# Patient Record
Sex: Female | Born: 1955 | Race: White | Hispanic: No | Smoking: Never smoker
Health system: Southern US, Community
[De-identification: ages and names within clinical notes are randomized; demographics above are authoritative.]

## PROBLEM LIST (undated history)

## (undated) DIAGNOSIS — I639 Cerebral infarction, unspecified: Secondary | ICD-10-CM

## (undated) DIAGNOSIS — C801 Malignant (primary) neoplasm, unspecified: Secondary | ICD-10-CM

## (undated) DIAGNOSIS — E079 Disorder of thyroid, unspecified: Secondary | ICD-10-CM

## (undated) HISTORY — DX: Disorder of thyroid, unspecified: E07.9

## (undated) HISTORY — PX: CARPAL TUNNEL RELEASE: SHX101

## (undated) HISTORY — PX: BREAST ENHANCEMENT SURGERY: SHX7

## (undated) HISTORY — DX: Cerebral infarction, unspecified: I63.9

## (undated) HISTORY — PX: COLONOSCOPY: SHX174

## (undated) HISTORY — DX: Malignant (primary) neoplasm, unspecified: C80.1

---

## 2007-03-21 DIAGNOSIS — I7300 Raynaud's syndrome without gangrene: Secondary | ICD-10-CM | POA: Insufficient documentation

## 2007-03-21 DIAGNOSIS — G43829 Menstrual migraine, not intractable, without status migrainosus: Secondary | ICD-10-CM | POA: Insufficient documentation

## 2010-07-08 DIAGNOSIS — J45909 Unspecified asthma, uncomplicated: Secondary | ICD-10-CM | POA: Insufficient documentation

## 2010-07-08 DIAGNOSIS — J309 Allergic rhinitis, unspecified: Secondary | ICD-10-CM | POA: Insufficient documentation

## 2013-12-20 DIAGNOSIS — B002 Herpesviral gingivostomatitis and pharyngotonsillitis: Secondary | ICD-10-CM | POA: Insufficient documentation

## 2014-03-12 DIAGNOSIS — G5601 Carpal tunnel syndrome, right upper limb: Secondary | ICD-10-CM | POA: Insufficient documentation

## 2014-07-13 DIAGNOSIS — M1611 Unilateral primary osteoarthritis, right hip: Secondary | ICD-10-CM | POA: Insufficient documentation

## 2014-07-22 DIAGNOSIS — M25551 Pain in right hip: Secondary | ICD-10-CM | POA: Insufficient documentation

## 2015-03-15 HISTORY — PX: NEURECTOMY FOOT: SUR888

## 2015-09-30 DIAGNOSIS — F4321 Adjustment disorder with depressed mood: Secondary | ICD-10-CM | POA: Insufficient documentation

## 2017-09-21 DIAGNOSIS — I639 Cerebral infarction, unspecified: Secondary | ICD-10-CM

## 2017-09-21 HISTORY — DX: Cerebral infarction, unspecified: I63.9

## 2017-12-25 ENCOUNTER — Encounter: Payer: Self-pay | Admitting: Gastroenterology

## 2018-01-18 ENCOUNTER — Ambulatory Visit (AMBULATORY_SURGERY_CENTER): Payer: Self-pay | Admitting: *Deleted

## 2018-01-18 ENCOUNTER — Encounter: Payer: Self-pay | Admitting: Gastroenterology

## 2018-01-18 ENCOUNTER — Telehealth: Payer: Self-pay | Admitting: *Deleted

## 2018-01-18 VITALS — Ht 62.0 in | Wt 115.0 lb

## 2018-01-18 DIAGNOSIS — Z1211 Encounter for screening for malignant neoplasm of colon: Secondary | ICD-10-CM

## 2018-01-18 MED ORDER — PEG-KCL-NACL-NASULF-NA ASC-C 140 G PO SOLR: 1.0000 | Freq: Once | ORAL | 0 refills | Status: AC

## 2018-01-18 NOTE — Telephone Encounter (Signed)
Patient came for PV today for direct screening colonoscopy. She states she had "TIA September 21, 2017". She went to [facility], test done and she was release that day. No other problems or concerns since then. [place] for [employer]? Please advise. Thank you, Robbin pv

## 2018-01-18 NOTE — Progress Notes (Signed)
Patient denies any allergies to eggs or soy. Patient denies any problems with anesthesia/sedation. Patient denies any oxygen use at home. Patient denies taking any diet/weight loss medications or blood thinners. EMMI education offered, pt declined. Plenvu universal co pay card given to pt.

## 2018-01-19 NOTE — Telephone Encounter (Signed)
Robbin,  This pt is cleared for anesthetic care at [facility].   Thanks,   Lakeem Rozo 

## 2018-01-19 NOTE — Telephone Encounter (Signed)
Noted! Thank you

## 2018-01-31 ENCOUNTER — Ambulatory Visit (AMBULATORY_SURGERY_CENTER): Payer: PRIVATE HEALTH INSURANCE | Admitting: Gastroenterology

## 2018-01-31 ENCOUNTER — Encounter: Payer: Self-pay | Admitting: Gastroenterology

## 2018-01-31 VITALS — BP 120/70 | HR 63 | Temp 97.8°F | Resp 12 | Ht 62.0 in | Wt 115.0 lb

## 2018-01-31 DIAGNOSIS — Z1211 Encounter for screening for malignant neoplasm of colon: Secondary | ICD-10-CM | POA: Diagnosis not present

## 2018-01-31 MED ORDER — SODIUM CHLORIDE 0.9 % IV SOLN: 500.0000 mL | Freq: Once | INTRAVENOUS | Status: DC

## 2018-01-31 NOTE — Progress Notes (Signed)
PT taken to PACU. Monitors in place. VSS. Report given to RN. 

## 2018-01-31 NOTE — Patient Instructions (Signed)
YOU HAD AN ENDOSCOPIC PROCEDURE TODAY AT THE [facility]:   Refer to the procedure report that was given to you for any specific questions about what was found during the examination.  If the procedure report does not answer your questions, please call your gastroenterologist to clarify.  If you requested that your care partner not be given the details of your procedure findings, then the procedure report has been included in a sealed envelope for you to review at your convenience later.  YOU SHOULD EXPECT: Some feelings of bloating in the abdomen. Passage of more gas than usual.  Walking can help get rid of the air that was put into your GI tract during the procedure and reduce the bloating. If you had a lower endoscopy (such as a colonoscopy or flexible sigmoidoscopy) you may notice spotting of blood in your stool or on the toilet paper. If you underwent a bowel prep for your procedure, you may not have a normal bowel movement for a few days.  Please Note:  You might notice some irritation and congestion in your nose or some drainage.  This is from the oxygen used during your procedure.  There is no need for concern and it should clear up in a day or so.  SYMPTOMS TO REPORT IMMEDIATELY:   Following lower endoscopy (colonoscopy or flexible sigmoidoscopy):  Excessive amounts of blood in the stool  Significant tenderness or worsening of abdominal pains  Swelling of the abdomen that is new, acute  Fever of 100F or higher  For urgent or emergent issues, a gastroenterologist can be reached at any hour by calling [phone].   DIET:  We do recommend a small meal at first, but then you may proceed to your regular diet.  Drink plenty of fluids but you should avoid alcoholic beverages for 24 hours.  ACTIVITY:  You should plan to take it easy for the rest of today and you should NOT DRIVE or use heavy machinery until tomorrow (because of the sedation medicines used during the test).     FOLLOW UP: Our staff will call the number listed on your records the next business day following your procedure to check on you and address any questions or concerns that you may have regarding the information given to you following your procedure. If we do not reach you, we will leave a message.  However, if you are feeling well and you are not experiencing any problems, there is no need to return our call.  We will assume that you have returned to your regular daily activities without incident.  If any biopsies were taken you will be contacted by phone or by letter within the next 1-3 weeks.  Please call us at [phone] if you have not heard about the biopsies in 3 weeks.    SIGNATURES/CONFIDENTIALITY: You and/or your care partner have signed paperwork which will be entered into your electronic medical record.  These signatures attest to the fact that that the information above on your After Visit Summary has been reviewed and is understood.  Full responsibility of the confidentiality of this discharge information lies with you and/or your care-partner. 

## 2018-01-31 NOTE — Op Note (Addendum)
[facility] Patient Name: Evelyn Martinez Procedure Date: 01/31/2018 2:10 PM MRN: [id number] Endoscopist: Mallie Mussel L. Loletha Carrow , MD Age: 62 Referring MD:  Date of Birth: 1956/02/15 Gender: Female Account #: [id number] Procedure:                Colonoscopy Indications:              Screening for colorectal malignant neoplasm                            (reportedly normal colonoscopy > 10 years ago at                            outside institution) Medicines:                Monitored Anesthesia Care Procedure:                After obtaining informed consent, the colonoscope                            was passed under direct vision. Throughout the                            procedure, the patient's blood pressure, pulse, and                            oxygen saturations were monitored continuously. The                            Model PCF-[id number]) scope was introduced                            through the anus and advanced to the the cecum,                            identified by appendiceal orifice and ileocecal                            valve. The colonoscopy was performed without                            difficulty. The patient tolerated the procedure                            well. The quality of the bowel preparation was                            excellent. The ileocecal valve, appendiceal                            orifice, and rectum were photographed. The bowel                            preparation used was Plenvu. Scope In: 2:20:02 PM Scope Out: 2:32:59 PM Scope Withdrawal Time: 0 hours 8 minutes 53 seconds  Total Procedure Duration: 0 hours 12  minutes 57 seconds  Findings:                 The perianal and digital rectal examinations were                            normal.                           The entire examined colon appeared normal on direct                            and retroflexion views. Complications:            No immediate  complications. Estimated Blood Loss:     Estimated blood loss: none. Impression:               - The entire examined colon is normal on direct and                            retroflexion views.                           - No specimens collected. Recommendation:           - Patient has a contact number available for                            emergencies. The signs and symptoms of potential                            delayed complications were discussed with the                            patient. Return to normal activities tomorrow.                            Written discharge instructions were provided to the                            patient.                           - Resume previous diet.                           - Continue present medications.                           - Repeat colonoscopy in 10 years for screening                            purposes. Tedi Hughson L. Loletha Carrow, MD 01/31/2018 2:39:31 PM This report has been signed electronically.

## 2018-02-01 ENCOUNTER — Telehealth: Payer: Self-pay | Admitting: *Deleted

## 2018-02-01 NOTE — Telephone Encounter (Signed)
  Follow up Call-  Call back number 01/31/2018  Post procedure Call Back phone  # [phone]  Permission to leave phone message Yes     Patient questions:  Do you have a fever, pain , or abdominal swelling? No. Pain Score  0 *  Have you tolerated food without any problems? Yes.    Have you been able to return to your normal activities? Yes.    Do you have any questions about your discharge instructions: Diet   No. Medications  No. Follow up visit  No.  Do you have questions or concerns about your Care? No.  Actions: * If pain score is 4 or above: No action needed, pain <4.

## 2018-10-17 ENCOUNTER — Ambulatory Visit: Payer: PRIVATE HEALTH INSURANCE | Admitting: Internal Medicine

## 2018-11-15 ENCOUNTER — Ambulatory Visit: Payer: PRIVATE HEALTH INSURANCE | Admitting: Internal Medicine

## 2018-12-20 ENCOUNTER — Ambulatory Visit (INDEPENDENT_AMBULATORY_CARE_PROVIDER_SITE_OTHER): Payer: PRIVATE HEALTH INSURANCE | Admitting: Internal Medicine

## 2018-12-20 ENCOUNTER — Other Ambulatory Visit: Payer: Self-pay

## 2018-12-20 VITALS — BP 112/70 | HR 66 | Temp 97.2°F | Resp 16 | Ht 62.0 in | Wt 118.6 lb

## 2018-12-20 DIAGNOSIS — Z136 Encounter for screening for cardiovascular disorders: Secondary | ICD-10-CM | POA: Diagnosis not present

## 2018-12-20 DIAGNOSIS — Z111 Encounter for screening for respiratory tuberculosis: Secondary | ICD-10-CM

## 2018-12-20 DIAGNOSIS — Z13220 Encounter for screening for lipoid disorders: Secondary | ICD-10-CM | POA: Diagnosis not present

## 2018-12-20 DIAGNOSIS — E559 Vitamin D deficiency, unspecified: Secondary | ICD-10-CM

## 2018-12-20 DIAGNOSIS — J4520 Mild intermittent asthma, uncomplicated: Secondary | ICD-10-CM

## 2018-12-20 DIAGNOSIS — E60 Dietary zinc deficiency: Secondary | ICD-10-CM

## 2018-12-20 DIAGNOSIS — Z79899 Other long term (current) drug therapy: Secondary | ICD-10-CM

## 2018-12-20 DIAGNOSIS — Z131 Encounter for screening for diabetes mellitus: Secondary | ICD-10-CM | POA: Diagnosis not present

## 2018-12-20 DIAGNOSIS — Z130 Encounter for screening for diseases of the blood and blood-forming organs and certain disorders involving the immune mechanism: Secondary | ICD-10-CM | POA: Diagnosis not present

## 2018-12-20 DIAGNOSIS — Z0001 Encounter for general adult medical examination with abnormal findings: Secondary | ICD-10-CM

## 2018-12-20 DIAGNOSIS — R030 Elevated blood-pressure reading, without diagnosis of hypertension: Secondary | ICD-10-CM | POA: Diagnosis not present

## 2018-12-20 DIAGNOSIS — Z0000 Encounter for general adult medical examination without abnormal findings: Secondary | ICD-10-CM

## 2018-12-20 DIAGNOSIS — Z1389 Encounter for screening for other disorder: Secondary | ICD-10-CM

## 2018-12-20 DIAGNOSIS — R5383 Other fatigue: Secondary | ICD-10-CM

## 2018-12-20 DIAGNOSIS — E039 Hypothyroidism, unspecified: Secondary | ICD-10-CM

## 2018-12-20 DIAGNOSIS — R7309 Other abnormal glucose: Secondary | ICD-10-CM

## 2018-12-20 MED ORDER — LEVOTHYROXINE SODIUM 25 MCG PO TABS: ORAL_TABLET | ORAL | 1 refills | Status: DC

## 2018-12-20 NOTE — Progress Notes (Signed)
Annual Screening/Preventative Visit & Comprehensive Evaluation &  Examination     This very nice 64 y.o.  WWF presents for a Screening /Preventative Visit & comprehensive evaluation and management of multiple medical co-morbidities.  Patient has been followed for labile elevated BP, abn lipids, glucose & Vitamin D Deficiency.      Patient had an episode of unilateral visual loss July 2019 and was evaluated in an ER with an extensive negative w/u including neg CT & MRA scans. Patient's BP was slightly elevated at that encounter & normalized.  Patient denies any cardiac symptoms as chest pain, palpitations, shortness of breath, dizziness or ankle swelling. Today's BP is at goal - 112/70.      Patient's lipids have been controlled with diet.     Patient had been on [place] Thyroid in the past , but had been off recently and about 3 weeks ago TSH was sl elevated at 4.490.     Finally, patient has history of relative Vitamin D Deficiency ("44.5"  / 2018).  Current Outpatient Medications on File Prior to Visit  Medication Sig  . albuterol (PROAIR HFA) 108 (90 Base) MCG/ACT inhaler INHALE 1 TO 2 PUFFS EVERY 4 TO 6 HOURS AS NEEDED  . Ascorbic Acid (VITAMIN C PO) Take 2,000 mg by mouth.   Jolyne Loa Grape-Goldenseal (BERBERINE COMPLEX PO) Take by mouth daily.  Marland Kitchen DICLOFENAC PO Take by mouth. Takes PRN for back pain  . ELDERBERRY PO Take by mouth. Takes 2 gummies daily  . fexofenadine (ALLEGRA) 180 MG tablet Take by mouth.  . fluticasone (FLONASE) 50 MCG/ACT nasal spray Place into the nose.  Marland Kitchen MAGNESIUM MALATE PO Take by mouth. Takes 1 tablet daily.  . montelukast (SINGULAIR) 10 MG tablet TAKE 1 TABLET BY MOUTH EVERYDAY AT BEDTIME  . OVER THE COUNTER MEDICATION Takes 2 B-complex gummies  daily.  Marland Kitchen OVER THE COUNTER MEDICATION Takes CBD 50 mg daily.  Marland Kitchen thyroid (ARMOUR) 65 MG tablet Take 75 mg by mouth.   . valACYclovir (VALTREX) 1000 MG tablet Take 1,000 mg by mouth daily. As needed  . VITAMIN D  PO Take by mouth. Takes 2 to 3 times a week  . VITAMIN K PO Take by mouth. Takes 2 to 3 times a week   No current facility-administered medications on file prior to visit.    Allergies  Allergen Reactions  . Epinephrine Palpitations    Increased HR   . Bactrim [Sulfamethoxazole-Trimethoprim] Hives  . Sulfamethoxazole Rash   Past Medical History:  Diagnosis Date  . 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 Stroke ([place]) 09/21/2017   TIA ( pt states under a lot of anxiety and stress);went home from [place]   . Thyroid disease    Health Maintenance  Topic Date Due  . Hepatitis C Screening  1955-04-26  . HIV Screening  10/14/1970  . PAP SMEAR-Modifier  10/13/1976  . MAMMOGRAM  10/13/2005  . INFLUENZA VACCINE  10/13/2018  . TETANUS/TDAP  05/02/2019  . COLONOSCOPY  02/01/2028   Last Colon - 01/31/2018 - Colon - Negative - dr Loletha Carrow - Recc 10 yr f/u due Dec 2029  Last MGM - 10/07/20120 - [place] & Negative  Past Surgical History:  Procedure Laterality Date  . BREAST ENHANCEMENT SURGERY     x2  . CARPAL TUNNEL RELEASE Bilateral   . CESAREAN SECTION  1983  . COLONOSCOPY  10 years ago    [place]=Normal exam per pt  . NEURECTOMY FOOT  2017   Family History  Problem Relation Age of Onset  . Uterine cancer Mother   . Diabetes Father   . Diabetes Brother   . Diabetes Sister   . Diabetes Sister   . Colon cancer Neg Hx   . Colon polyps Neg Hx   . Esophageal cancer Neg Hx   . Rectal cancer Neg Hx   . Stomach cancer Neg Hx    Social History   Tobacco Use  . Smoking status: Never Smoker  . Smokeless tobacco: Never Used  Substance Use Topics  . Alcohol use: Yes    Alcohol/week: 14.0 standard drinks    Types: 14 Glasses of wine per week  . Drug use: Not Currently    ROS Constitutional: Denies fever, chills, weight loss/gain, headaches, insomnia,  night sweats, and change in appetite. Does c/o fatigue. Eyes: Denies redness, blurred vision, diplopia,  discharge, itchy, watery eyes.  ENT: Denies discharge, congestion, post nasal drip, epistaxis, sore throat, earache, hearing loss, dental pain, Tinnitus, Vertigo, Sinus pain, snoring.  Cardio: Denies chest pain, palpitations, irregular heartbeat, syncope, dyspnea, diaphoresis, orthopnea, PND, claudication, edema Respiratory: denies cough, dyspnea, DOE, pleurisy, hoarseness, laryngitis, wheezing.  Gastrointestinal: Denies dysphagia, heartburn, reflux, water brash, pain, cramps, nausea, vomiting, bloating, diarrhea, constipation, hematemesis, melena, hematochezia, jaundice, hemorrhoids Genitourinary: Denies dysuria, frequency, urgency, nocturia, hesitancy, discharge, hematuria, flank pain Breast: Breast lumps, nipple discharge, bleeding.  Musculoskeletal: Denies arthralgia, myalgia, stiffness, Jt. Swelling, pain, limp, and strain/sprain. Denies falls. Skin: Denies puritis, rash, hives, warts, acne, eczema, changing in skin lesion Neuro: No weakness, tremor, incoordination, spasms, paresthesia, pain Psychiatric: Denies confusion, memory loss, sensory loss. Denies Depression. Endocrine: Denies change in weight, skin, hair change, nocturia, and paresthesia, diabetic polys, visual blurring, hyper / hypo glycemic episodes.  Heme/Lymph: No excessive bleeding, bruising, enlarged lymph nodes.  Physical Exam  BP 112/70   Pulse 66   Temp (!) 97.2 F (36.2 C)   Resp 16   Ht 5\' 2"  (1.575 m)   Wt 118 lb 9.6 oz (53.8 kg)   BMI 21.69 kg/m   General Appearance: Well nourished, well groomed and in no apparent distress.  Eyes: PERRLA, EOMs, conjunctiva no swelling or erythema, normal fundi and vessels. Sinuses: No frontal/maxillary tenderness ENT/Mouth: EACs patent / TMs  nl. Nares clear without erythema, swelling, mucoid exudates. Oral hygiene is good. No erythema, swelling, or exudate. Tongue normal, non-obstructing. Tonsils not swollen or erythematous. Hearing normal.  Neck: Supple, thyroid not  palpable. No bruits, nodes or JVD. Respiratory: Respiratory effort normal.  BS equal and clear bilateral without rales, rhonci, wheezing or stridor. Cardio: Heart sounds are normal with regular rate and rhythm and no murmurs, rubs or gallops. Peripheral pulses are normal and equal bilaterally without edema. No aortic or femoral bruits. Chest: symmetric with normal excursions and percussion. Breasts: Symmetric, without lumps, nipple discharge, retractions, or fibrocystic changes.  Abdomen: Flat, soft with bowel sounds active. Nontender, no guarding, rebound, hernias, masses, or organomegaly.  Lymphatics: Non tender without lymphadenopathy.  Genitourinary: Had recent pap in June 2020 at her [place]. Musculoskeletal: Full ROM all peripheral extremities, joint stability, 5/5 strength, and normal gait. Skin: Warm and dry without rashes, lesions, cyanosis, clubbing or  ecchymosis.  Neuro: Cranial nerves intact, reflexes equal bilaterally. Normal muscle tone, no cerebellar symptoms. Sensation intact.  Pysch: Alert and oriented X 3, normal affect, Insight and Judgment appropriate.   Assessment and Plan  1. Annual Preventative Screening Examination  2. Elevated BP without diagnosis of hypertension  -  EKG 12-Lead - Urinalysis, Routine w reflex microscopic - Microalbumin / creatinine urine ratio - CBC with Differential/Platelet - Magnesium  3. Lipid screening  - EKG 12-Lead - Lipid panel  4. Abnormal glucose  - EKG 12-Lead - Hemoglobin A1c - Insulin, random  5. Vitamin D deficiency  - VITAMIN D 25 Hydroxyl  6. Acquired hypothyroidism  - levothyroxine (SYNTHROID) 25 MCG tablet; Take 1 tablet daily on an empty stomach with only water for 30 minutes & no Antacid meds, Calcium or Magnesium for 4 hours & avoid Biotin  Dispense: 90 tablet; Refill: 1  7. Zinc deficiency  - Zinc  8. Mild intermittent asthma without complication   9. Screening-pulmonary TB  - TB Skin Test  10.  Screening for ischemic heart disease  - EKG 12-Lead  11. Screening for AAA (aortic abdominal aneurysm)  - [place], RETROPERITNL ABD,  LTD  12. Fatigue, unspecified type  - Iron,Total/Total Iron Binding Cap - Vitamin B12 - CBC with Differential/Platelet  13. Medication management  - Urinalysis, Routine w reflex microscopic - Microalbumin / creatinine urine ratio - CBC with Differential/Platelet - Magnesium - Lipid panel - Hemoglobin A1c - Insulin, random - VITAMIN D 25 Hydroxyl         Patient was counseled in prudent diet to achieve/maintain BMI less than 25 for weight control, BP monitoring, regular exercise and medications. Discussed med's effects and SE's. Screening labs and tests as requested with regular follow-up as recommended. Over 40 minutes of exam, counseling, chart review and high complex critical decision making was performed.   Kirtland Bouchard, MD

## 2018-12-20 NOTE — Patient Instructions (Signed)

## 2018-12-22 ENCOUNTER — Encounter: Payer: Self-pay | Admitting: Internal Medicine

## 2018-12-22 DIAGNOSIS — Z13220 Encounter for screening for lipoid disorders: Secondary | ICD-10-CM | POA: Insufficient documentation

## 2018-12-22 DIAGNOSIS — E559 Vitamin D deficiency, unspecified: Secondary | ICD-10-CM | POA: Insufficient documentation

## 2018-12-22 DIAGNOSIS — R030 Elevated blood-pressure reading, without diagnosis of hypertension: Secondary | ICD-10-CM | POA: Insufficient documentation

## 2018-12-22 DIAGNOSIS — E039 Hypothyroidism, unspecified: Secondary | ICD-10-CM | POA: Insufficient documentation

## 2018-12-22 DIAGNOSIS — R7309 Other abnormal glucose: Secondary | ICD-10-CM | POA: Insufficient documentation

## 2018-12-22 LAB — URINALYSIS, ROUTINE W REFLEX MICROSCOPIC
Bacteria, UA: NONE SEEN /HPF
Bilirubin Urine: NEGATIVE
Glucose, UA: NEGATIVE
Hgb urine dipstick: NEGATIVE
Hyaline Cast: NONE SEEN /LPF
Ketones, ur: NEGATIVE
Nitrite: NEGATIVE
Protein, ur: NEGATIVE
RBC / HPF: NONE SEEN /HPF (ref 0–2)
Specific Gravity, Urine: 1.021 (ref 1.001–1.03)
WBC, UA: NONE SEEN /HPF (ref 0–5)
pH: 6.5 (ref 5.0–8.0)

## 2018-12-22 LAB — CBC WITH DIFFERENTIAL/PLATELET
Absolute Monocytes: 523 cells/uL (ref 200–950)
Basophils Absolute: 48 cells/uL (ref 0–200)
Basophils Relative: 1 %
Eosinophils Absolute: 149 cells/uL (ref 15–500)
Eosinophils Relative: 3.1 %
HCT: 41.7 % (ref 35.0–45.0)
Hemoglobin: 13.7 g/dL (ref 11.7–15.5)
Lymphs Abs: 1992 cells/uL (ref 850–3900)
MCH: 31.4 pg (ref 27.0–33.0)
MCHC: 32.9 g/dL (ref 32.0–36.0)
MCV: 95.6 fL (ref 80.0–100.0)
MPV: 10.8 fL (ref 7.5–12.5)
Monocytes Relative: 10.9 %
Neutro Abs: 2088 cells/uL (ref 1500–7800)
Neutrophils Relative %: 43.5 %
Platelets: 263 10*3/uL (ref 140–400)
RBC: 4.36 10*6/uL (ref 3.80–5.10)
RDW: 13.1 % (ref 11.0–15.0)
Total Lymphocyte: 41.5 %
WBC: 4.8 10*3/uL (ref 3.8–10.8)

## 2018-12-22 LAB — HEMOGLOBIN A1C
Hgb A1c MFr Bld: 5.2 % of total Hgb (ref <5.7)
Mean Plasma Glucose: 103 (calc)
eAG (mmol/L): 5.7 (calc)

## 2018-12-22 LAB — LIPID PANEL
Cholesterol: 203 mg/dL — ABNORMAL HIGH (ref <200)
HDL: 117 mg/dL (ref >=50)
LDL Cholesterol (Calc): 76 mg/dL (calc)
Non-HDL Cholesterol (Calc): 86 mg/dL (calc) (ref <130)
Total CHOL/HDL Ratio: 1.7 (calc) (ref <5.0)
Triglycerides: 34 mg/dL (ref <150)

## 2018-12-22 LAB — IRON, TOTAL/TOTAL IRON BINDING CAP: Iron: 135 ug/dL (ref 45–160)

## 2018-12-22 LAB — INSULIN, RANDOM: Insulin: 2.6 u[IU]/mL

## 2018-12-22 LAB — IRON,?TOTAL/TOTAL IRON BINDING CAP
%SAT: 60 % (calc) — ABNORMAL HIGH (ref 16–45)
TIBC: 225 mcg/dL (calc) — ABNORMAL LOW (ref 250–450)

## 2018-12-22 LAB — MAGNESIUM: Magnesium: 1.9 mg/dL (ref 1.5–2.5)

## 2018-12-22 LAB — MICROALBUMIN / CREATININE URINE RATIO
Creatinine, Urine: 121 mg/dL (ref 20–275)
Microalb Creat Ratio: 2 mcg/mg creat (ref <30)
Microalb, Ur: 0.3 mg/dL

## 2018-12-22 LAB — VITAMIN B12: Vitamin B-12: 318 pg/mL (ref 200–1100)

## 2018-12-22 LAB — ZINC: Zinc: 75 ug/dL (ref 60–130)

## 2018-12-22 LAB — VITAMIN D 25 HYDROXY (VIT D DEFICIENCY, FRACTURES): Vit D, 25-Hydroxy: 68 ng/mL (ref 30–100)

## 2019-03-27 ENCOUNTER — Ambulatory Visit: Payer: PRIVATE HEALTH INSURANCE | Admitting: Adult Health

## 2019-04-18 ENCOUNTER — Ambulatory Visit (INDEPENDENT_AMBULATORY_CARE_PROVIDER_SITE_OTHER): Payer: PRIVATE HEALTH INSURANCE | Admitting: Internal Medicine

## 2019-04-18 ENCOUNTER — Other Ambulatory Visit: Payer: Self-pay

## 2019-04-18 VITALS — BP 116/76 | HR 60 | Temp 97.1°F | Resp 16 | Ht 62.0 in | Wt 120.0 lb

## 2019-04-18 DIAGNOSIS — J300 Vasomotor rhinitis: Secondary | ICD-10-CM | POA: Diagnosis not present

## 2019-04-18 MED ORDER — DEXAMETHASONE 4 MG PO TABS: ORAL_TABLET | ORAL | 0 refills | Status: DC

## 2019-04-18 MED ORDER — IPRATROPIUM BROMIDE 0.06 % NA SOLN: NASAL | 3 refills | Status: DC

## 2019-04-18 NOTE — Progress Notes (Signed)
History of Present Illness:     This very nice 64 yo MWF w hx/o Allergic Rhinitis presents with c/o watery nasal drainage w/o sx's of fever, sneezing, purulent nasal drainage or itching of eyes or nose   Medications  Current Outpatient Medications (Endocrine & Metabolic):  .  levothyroxine (SYNTHROID) 25 MCG tablet, Take 1 tablet daily on an empty stomach with only water for 30 minutes & no Antacid meds, Calcium or Magnesium for 4 hours & avoid Biotin .  albuterol (PROAIR HFA) 108 (90 Base) MCG/ACT inhaler, INHALE 1 TO 2 PUFFS EVERY 4 TO 6 HOURS AS NEEDED .  fexofenadine (ALLEGRA) 180 MG tablet, Take by mouth. .  fluticasone (FLONASE) 50 MCG/ACT nasal spray, Place into the nose. .  montelukast (SINGULAIR) 10 MG tablet, TAKE 1 TABLET BY MOUTH EVERYDAY AT BEDTIME .  DICLOFENAC PO, Take by mouth. Takes PRN for back pain .  Ascorbic Acid (VITAMIN C PO), Take 2,000 mg by mouth.  Jolyne Loa Grape-Goldenseal (BERBERINE COMPLEX PO), Take by mouth daily. Marland Kitchen  ELDERBERRY PO, Take by mouth. Takes 2 gummies daily .  MAGNESIUM MALATE PO, Take by mouth. Takes 1 tablet daily. Marland Kitchen  OVER THE COUNTER MEDICATION, Takes 2 B-complex gummies  daily. Marland Kitchen  OVER THE COUNTER MEDICATION, Takes CBD 50 mg daily. .  valACYclovir (VALTREX) 1000 MG tablet, Take 1,000 mg by mouth daily. As needed .  VITAMIN D PO, Take by mouth. Takes 2 to 3 times a week .  VITAMIN K PO, Take by mouth. Takes 2 to 3 times a week  Problem list She has Allergic rhinitis; Asthma; Herpes gingivostomatitis; Menstrual migraine; Primary osteoarthritis of right hip; Raynaud's phenomenon; Right carpal tunnel syndrome; Right hip pain; Situational depression; Acquired hypothyroidism; Elevated BP without diagnosis of hypertension; Lipid screening; Abnormal glucose; and Vitamin D deficiency on their problem list.   Observations/Objective:  BP 116/76   Pulse 60   Temp (!) 97.1 F (36.2 C)   Resp 16   Ht 5\' 2"  (1.575 m)   Wt 120 lb (54.4 kg)    BMI 21.95 kg/m   HEENT - WNL. Neck - supple.  Chest - Clear equal BS. Cor - Nl HS. RRR w/o sig MGR. PP 1(+). No edema. MS- FROM w/o deformities.  Gait Nl. Neuro -  Nl w/o focal abnormalities.  Assessment and Plan:  1. Vasomotor rhinitis  - ipratropium 0.06 % nasal spray; Use 1 to 2 sprays each nostril 2 to 3 x /day as needed  Disp: 45 mL; Rf 3  - dexamethasone 4 MG tablet; Take 1 tab 3 x day - 2 days, then 2 x day - 2 days, then 1 tab daily  Disp: 20 tab; Rf 0        I discussed the assessment and treatment plan with the patient. The patient was provided an opportunity to ask questions and all were answered. The patient agreed with the plan and demonstrated an understanding of the instructions. The patient was advised to call back or seek an in-person evaluation if the symptoms worsen or if the condition fails to improve as anticipated.  Kirtland Bouchard, MD  This note is not being shared with the patient for the following reason: To prevent harm (release of this note would result in harm to the life or physical safety of the patient or another).

## 2019-04-20 ENCOUNTER — Encounter: Payer: Self-pay | Admitting: Internal Medicine

## 2019-05-21 ENCOUNTER — Other Ambulatory Visit: Payer: Self-pay | Admitting: Internal Medicine

## 2019-05-21 MED ORDER — MONTELUKAST SODIUM 10 MG PO TABS: ORAL_TABLET | ORAL | 1 refills | Status: DC

## 2019-06-04 ENCOUNTER — Other Ambulatory Visit: Payer: Self-pay | Admitting: Internal Medicine

## 2019-06-04 DIAGNOSIS — E039 Hypothyroidism, unspecified: Secondary | ICD-10-CM

## 2019-06-04 DIAGNOSIS — Z79899 Other long term (current) drug therapy: Secondary | ICD-10-CM

## 2019-07-04 ENCOUNTER — Ambulatory Visit: Payer: PRIVATE HEALTH INSURANCE | Admitting: Internal Medicine

## 2019-07-29 ENCOUNTER — Ambulatory Visit: Payer: PRIVATE HEALTH INSURANCE | Admitting: Adult Health Nurse Practitioner

## 2019-07-29 ENCOUNTER — Ambulatory Visit: Payer: PRIVATE HEALTH INSURANCE | Admitting: Internal Medicine

## 2019-07-29 NOTE — Progress Notes (Deleted)
6 MONTH FOLLOW UP  Assessment and Plan:    Diagnoses and all orders for this visit:  Elevated BP without diagnosis of hypertension  Mild intermittent asthma without complication  Hypothyroidism, unspecified type  Vitamin D deficiency  Abnormal glucose  Raynaud's phenomenon without gangrene  Right carpal tunnel syndrome  Primary osteoarthritis of right hip  Situational depression  Lipid screening  Right hip pain  Medication management     Continue diet and meds as discussed. Further disposition pending results of labs. Discussed med's effects and SE's.  Patient agrees with plan of care and opportunity to ask questions/voice concerns. Over 30 minutes of chart review, face to face interview, exam, counseling, and critical decision making was performed.   Future Appointments  Date Time Provider [place]  07/29/2019 10:30 AM Garnet Sierras, NP [facility] None    ----------------------------------------------------------------------------------------------------------------------  HPI 64 y.o. female  presents for 3 month follow up on HTN, HLD, abnormal glucose with history of pre-diabetes, weight and vitamin D deficiency.   BMI is There is no height or weight on file to calculate BMI., she {HAS HAS [id number] been working on diet and exercise. Wt Readings from Last 3 Encounters:  04/18/19 120 lb (54.4 kg)  12/20/18 118 lb 9.6 oz (53.8 kg)  01/31/18 115 lb (52.2 kg)     HTN predates *** Her blood pressure {HAS HAS NOT:18834} been controlled at home, today their BP is    She {DOES_DOES [id number] workout. She denies any cardiac symptoms, chest pains, palpitations, shortness of breath, dizziness or lower extremity edema.      She {ACTION; IS/IS GI:087931 on cholesterol medication {Cholesterol meds:21887} and denies myalgias.   Her cholesterol {ACTION; IS/IS NOT:21021397} at goal. The cholesterol last visit was:   Lab Results  Component Value Date    CHOL 203 (H) 12/20/2018   HDL 117 12/20/2018   LDLCALC 76 12/20/2018   TRIG 34 12/20/2018   CHOLHDL 1.7 12/20/2018    She {Has/has not:18111} been working on diet and exercise for prediabetes, and denies {Symptoms; diabetes w/o none:19199}. Last A1C in the office was:  Lab Results  Component Value Date   HGBA1C 5.2 12/20/2018     Patient {Action; does/does not:19097} have history of CKD.  Their last GFR was:  No results found for: GFRNONAA No results found for: GFRAA   Patient is on Vitamin D supplement for deficiency ( *** ) Lab Results  Component Value Date   VD25OH 68 12/20/2018        Current Medications:  Current Outpatient Medications on File Prior to Visit  Medication Sig  . albuterol (PROAIR HFA) 108 (90 Base) MCG/ACT inhaler INHALE 1 TO 2 PUFFS EVERY 4 TO 6 HOURS AS NEEDED  . Ascorbic Acid (VITAMIN C PO) Take 2,000 mg by mouth.   . dexamethasone (DECADRON) 4 MG tablet Take 1 tab 3 x day - 2 days, then 2 x day - 2 days, then 1 tab daily  . ELDERBERRY PO Take by mouth. Takes 2 gummies daily  . fexofenadine (ALLEGRA) 180 MG tablet Take by mouth.  . fluticasone (FLONASE) 50 MCG/ACT nasal spray Place into the nose.  Marland Kitchen ipratropium (ATROVENT) 0.06 % nasal spray Use 1 to 2 sprays each nostril 2 to 3 x /day as needed  . levothyroxine (SYNTHROID) 25 MCG tablet Take 1 tablet daily on an empty stomach with only water for 30 minutes & no Antacid meds, Calcium or Magnesium for 4 hours & avoid Biotin  . MAGNESIUM MALATE PO  Take by mouth. Takes 1 tablet daily.  . montelukast (SINGULAIR) 10 MG tablet Take 1 tablet Daily for Allergies  . OVER THE COUNTER MEDICATION B-complex gummy, 2 per day  . TURMERIC PO Take 1 tablet by mouth daily.  Marland Kitchen VITAMIN D PO Take 5,000 Units by mouth. Takes 4 times a week  . VITAMIN K PO Take by mouth. Takes 2 to 3 times a week  . zinc gluconate 50 MG tablet Take 50 mg by mouth daily.   No current facility-administered medications on file prior to  visit.    Allergies:  Allergies  Allergen Reactions  . Epinephrine Palpitations    Increased HR   . Bactrim [Sulfamethoxazole-Trimethoprim] Hives  . Sulfamethoxazole Rash      Medical History:  Past Medical History:  Diagnosis Date  . Cancer ([place])    basel cell  . Stroke ([place]) 09/21/2017   TIA ( pt states under a lot of anxiety and stress);went home from [place]   . Thyroid disease      Family history- Reviewed and unchanged Family History  Problem Relation Age of Onset  . Uterine cancer Mother   . Diabetes Father   . Diabetes Brother   . Diabetes Sister   . Diabetes Sister   . Colon cancer Neg Hx   . Colon polyps Neg Hx   . Esophageal cancer Neg Hx   . Rectal cancer Neg Hx   . Stomach cancer Neg Hx      Social history- Reviewed and unchanged Social History   Tobacco Use  . Smoking status: Never Smoker  . Smokeless tobacco: Never Used  Substance Use Topics  . Alcohol use: Yes    Alcohol/week: 14.0 standard drinks    Types: 14 Glasses of wine per week  . Drug use: Not Currently     Names of Other Physician/Practitioners you currently use: 1. [facility] here for primary care 2. Eye Exam: *** 3. Dental Exam ***   Patient Care Team: Unk Pinto, MD as PCP - General (Internal Medicine)    Screening Tests: Immunization History  Administered Date(s) Administered  . PPD Test 12/20/2018     Vaccinations: TD or Tdap: ***  Influenza: ***  Pneumococcal: *** Prevnar13:  Shingles: Zostavax/Shingrix: ** HPV: ***  Preventative Care: Last colonoscopy: 01/2018 Last mammogram: 12/2018, Due for 2021 Last pap smear/pelvic exam: ***   DEXA:Due? Hep C screening [id number]):   Imaging: Chest X-ray: EKG: 12/2018 ECHO:    Review of Systems:  ROS    Physical Exam: There were no vitals taken for this visit. Wt Readings from Last 3 Encounters:  04/18/19 120 lb (54.4 kg)  12/20/18 118 lb 9.6 oz (53.8  kg)  01/31/18 115 lb (52.2 kg)   General Appearance: Well nourished, in no apparent distress. Eyes: PERRLA, EOMs, conjunctiva no swelling or erythema Sinuses: No Frontal/maxillary tenderness ENT/Mouth: Ext aud canals clear, TMs without erythema, bulging. No erythema, swelling, or exudate on post pharynx.  Tonsils not swollen or erythematous. Hearing normal.  Neck: Supple, thyroid normal.  Respiratory: Respiratory effort normal, BS equal bilaterally without rales, rhonchi, wheezing or stridor.  Cardio: RRR with no MRGs. Brisk peripheral pulses without edema.  Abdomen: Soft, + BS.  Non tender, no guarding, rebound, hernias, masses. Lymphatics: Non tender without lymphadenopathy.  Musculoskeletal: Full ROM, 5/5 strength, {PSY - GAIT AND STATION:22860} gait Skin: Warm, dry without rashes, lesions, ecchymosis.  Neuro: Cranial nerves intact. No cerebellar symptoms.  Psych: Awake and oriented  X 3, normal affect, Insight and Judgment appropriate.    Garnet Sierras, NP [facility] 12:17 AM

## 2019-08-06 ENCOUNTER — Ambulatory Visit: Payer: PRIVATE HEALTH INSURANCE | Admitting: Adult Health Nurse Practitioner

## 2019-08-22 ENCOUNTER — Ambulatory Visit (INDEPENDENT_AMBULATORY_CARE_PROVIDER_SITE_OTHER): Payer: 59 | Admitting: Adult Health Nurse Practitioner

## 2019-08-22 ENCOUNTER — Encounter: Payer: Self-pay | Admitting: Adult Health Nurse Practitioner

## 2019-08-22 ENCOUNTER — Other Ambulatory Visit: Payer: Self-pay

## 2019-08-22 VITALS — BP 96/62 | HR 64 | Temp 97.4°F | Resp 16 | Ht 62.0 in | Wt 112.4 lb

## 2019-08-22 DIAGNOSIS — Z79899 Other long term (current) drug therapy: Secondary | ICD-10-CM

## 2019-08-22 DIAGNOSIS — R7309 Other abnormal glucose: Secondary | ICD-10-CM

## 2019-08-22 DIAGNOSIS — J4520 Mild intermittent asthma, uncomplicated: Secondary | ICD-10-CM

## 2019-08-22 DIAGNOSIS — J300 Vasomotor rhinitis: Secondary | ICD-10-CM

## 2019-08-22 DIAGNOSIS — R030 Elevated blood-pressure reading, without diagnosis of hypertension: Secondary | ICD-10-CM | POA: Diagnosis not present

## 2019-08-22 DIAGNOSIS — E039 Hypothyroidism, unspecified: Secondary | ICD-10-CM

## 2019-08-22 DIAGNOSIS — E559 Vitamin D deficiency, unspecified: Secondary | ICD-10-CM

## 2019-08-22 LAB — CBC WITH DIFFERENTIAL/PLATELET
Absolute Monocytes: 441 cells/uL (ref 200–950)
Basophils Absolute: 49 cells/uL (ref 0–200)
Basophils Relative: 1 %
Eosinophils Absolute: 98 cells/uL (ref 15–500)
Eosinophils Relative: 2 %
HCT: 40.7 % (ref 35.0–45.0)
Hemoglobin: 13.5 g/dL (ref 11.7–15.5)
Lymphs Abs: 1088 cells/uL (ref 850–3900)
MCH: 31.8 pg (ref 27.0–33.0)
MCHC: 33.2 g/dL (ref 32.0–36.0)
MCV: 95.8 fL (ref 80.0–100.0)
MPV: 10.4 fL (ref 7.5–12.5)
Monocytes Relative: 9 %
Neutro Abs: 3224 cells/uL (ref 1500–7800)
Neutrophils Relative %: 65.8 %
Platelets: 322 10*3/uL (ref 140–400)
RBC: 4.25 10*6/uL (ref 3.80–5.10)
RDW: 12.9 % (ref 11.0–15.0)
Total Lymphocyte: 22.2 %
WBC: 4.9 10*3/uL (ref 3.8–10.8)

## 2019-08-22 LAB — COMPLETE METABOLIC PANEL WITH GFR
AG Ratio: 1.7 (calc) (ref 1.0–2.5)
ALT: 14 U/L (ref 6–29)
AST: 25 U/L (ref 10–35)
Albumin: 4.2 g/dL (ref 3.6–5.1)
Alkaline phosphatase (APISO): 48 U/L (ref 37–153)
BUN: 18 mg/dL (ref 7–25)
CO2: 29 mmol/L (ref 20–32)
Calcium: 9.9 mg/dL (ref 8.6–10.4)
Chloride: 102 mmol/L (ref 98–110)
Creat: 0.95 mg/dL (ref 0.50–0.99)
GFR, Est African American: 74 mL/min/{1.73_m2} (ref >=60)
GFR, Est Non African American: 64 mL/min/{1.73_m2} (ref >=60)
Globulin: 2.5 g/dL (calc) (ref 1.9–3.7)
Glucose, Bld: 97 mg/dL (ref 65–99)
Potassium: 4.4 mmol/L (ref 3.5–5.3)
Sodium: 140 mmol/L (ref 135–146)
Total Bilirubin: 0.6 mg/dL (ref 0.2–1.2)
Total Protein: 6.7 g/dL (ref 6.1–8.1)

## 2019-08-22 LAB — TSH: TSH: 2.54 mIU/L (ref 0.40–4.50)

## 2019-08-22 NOTE — Progress Notes (Signed)
3 Month Follow Up   Assessment and Plan:   Evelyn Martinez was seen today for follow-up.  Diagnoses and all orders for this visit:  Elevated BP without diagnosis of hypertension Controlled today Continue to monitor  Abnormal glucose Discussed dietary and exercise modifications  Vitamin D deficiency Continue supplementation to maintain goal of 60-100 Taking Vitamin D 5,000 IU four times a week.  Mild intermittent asthma without complication No maintenance inhaler Has Albuterol inhaler, in date No recent flares.  Hypothyroidism, unspecified type Taking levothyroxine 26mcg daily Reminder to take on an empty stomach 30-19mins before first meal of the day. No antacid medications for 4 hours.  Vasomotor rhinitis Doing well at this time Using Flonase daily Continue to monitor  Medication management Continued    Continue diet and meds as discussed. Further disposition pending results of labs. Discussed med's effects and SE's.  Patient agrees with plan of care and opportunity to ask questions/voice concerns. Over 30 minutes of face to face chart review, face to face interview, exam, counseling, and critical decision making was performed.   No future appointments.  ----------------------------------------------------------------------------------------------------------------------  HPI 64 y.o. female  presents for 3 month follow up on HTN, HLD, abnormal glucose with history of pre-diabetes, weight and vitamin D deficiency.   She reports that overall she is doing well.  She has no health or medication concerns today.  BMI is Body mass index is 20.56 kg/m., she has been working on diet and exercise. Wt Readings from Last 3 Encounters:  08/22/19 112 lb 6.4 oz (51 kg)  04/18/19 120 lb (54.4 kg)  12/20/18 118 lb 9.6 oz (53.8 kg)     She is being monitored for elevated blood pressure without diagnosis of HTN.   Her blood pressure has been controlled at home, today their BP is  BP: 96/62  She does workout. She denies any cardiac symptoms, chest pains, palpitations, shortness of breath, dizziness or lower extremity edema.      She is not on cholesterol medication.   Her cholesterol is at goal. The cholesterol last visit was:   Lab Results  Component Value Date   CHOL 203 (H) 12/20/2018   HDL 117 12/20/2018   LDLCALC 76 12/20/2018   TRIG 34 12/20/2018   CHOLHDL 1.7 12/20/2018    She has been working on diet and exercise for prediabetes, and denies increased appetite, nausea, paresthesia of the feet, polydipsia, polyuria and visual disturbances. Last A1C in the office was:  Lab Results  Component Value Date   HGBA1C 5.2 12/20/2018     Patient does not have history of CKD.  Their last GFR was:  No results found for: GFRNONAA No results found for: GFRAA   Patient is on Vitamin D supplement for deficiency (44.5 / 2018 ).  She is taking supplementation 5,000IU four times a week. Lab Results  Component Value Date   VD25OH 68 12/20/2018        Current Medications:  Current Outpatient Medications on File Prior to Visit  Medication Sig  . albuterol (PROAIR HFA) 108 (90 Base) MCG/ACT inhaler INHALE 1 TO 2 PUFFS EVERY 4 TO 6 HOURS AS NEEDED  . Ascorbic Acid (VITAMIN C PO) Take 2,000 mg by mouth.   . ELDERBERRY PO Take by mouth. Takes 2 gummies daily  . fexofenadine (ALLEGRA) 180 MG tablet Take by mouth.  . fluticasone (FLONASE) 50 MCG/ACT nasal spray Place into the nose.  . levothyroxine (SYNTHROID) 25 MCG tablet Take 1 tablet daily on an empty stomach  with only water for 30 minutes & no Antacid meds, Calcium or Magnesium for 4 hours & avoid Biotin  . MAGNESIUM MALATE PO Take by mouth. Takes 1 tablet daily.  . montelukast (SINGULAIR) 10 MG tablet Take 1 tablet Daily for Allergies  . OVER THE COUNTER MEDICATION B-complex gummy, 2 per day  . TURMERIC PO Take 1 tablet by mouth daily.  Marland Kitchen VITAMIN D PO Take 5,000 Units by mouth. Takes 4 times a week  . VITAMIN  K PO Take by mouth. Takes 2 to 3 times a week  . zinc gluconate 50 MG tablet Take 50 mg by mouth daily.   No current facility-administered medications on file prior to visit.    Allergies:  Allergies  Allergen Reactions  . Epinephrine Palpitations    Increased HR   . Bactrim [Sulfamethoxazole-Trimethoprim] Hives  . Sulfamethoxazole Rash      Medical History:  Past Medical History:  Diagnosis Date  . Cancer ([place])    basel cell  . Stroke ([place]) 09/21/2017   TIA ( pt states under a lot of anxiety and stress);went home from [place]   . Thyroid disease      Family history- Reviewed and unchanged Family History  Problem Relation Age of Onset  . Uterine cancer Mother   . Diabetes Father   . Diabetes Brother   . Diabetes Sister   . Diabetes Sister   . Colon cancer Neg Hx   . Colon polyps Neg Hx   . Esophageal cancer Neg Hx   . Rectal cancer Neg Hx   . Stomach cancer Neg Hx      Social history- Reviewed and unchanged Social History   Tobacco Use  . Smoking status: Never Smoker  . Smokeless tobacco: Never Used  Vaping Use  . Vaping Use: Never used  Substance Use Topics  . Alcohol use: Yes    Alcohol/week: 14.0 standard drinks    Types: 14 Glasses of wine per week  . Drug use: Not Currently     Names of Other Physician/Practitioners you currently use: 1. [facility] here for primary care 2. Eye Exam: Due for 2021 3. Dental Exam Q1months, last 04/2019   Patient Care Team: Unk Pinto, MD as PCP - General (Internal Medicine)    Screening Tests: Immunization History  Administered Date(s) Administered  . PFIZER SARS-COV-2 Vaccination 06/04/2019, 07/02/2019  . PPD Test 12/20/2018     Vaccinations: TD or Tdap: 2011, due, would like at physical  Influenza: Due for 2020 Pneumococcal: N/A Prevnar13: N/A Shingles: Zostavax/Shingrix: Discussed with patient   Preventative Care: Last colonoscopy: 2019 Last  mamm[facility]  Dr Ileene Patrick, scheduled for 2021 Last pap smear/pelvic exam: 06/2019 DEXA: Managed with GYN, UTD Hep C screening ([id number]): Due at physical   Imaging: Chest X-ray: EKG: ECHO:    Review of Systems:  Review of Systems  Constitutional: Negative for chills, diaphoresis, fever, malaise/fatigue and weight loss.  HENT: Negative for congestion, ear discharge, ear pain, hearing loss, nosebleeds, sinus pain, sore throat and tinnitus.   Eyes: Negative for blurred vision, double vision, photophobia, pain, discharge and redness.  Respiratory: Negative for cough, hemoptysis, sputum production, shortness of breath, wheezing and stridor.   Cardiovascular: Negative for chest pain, palpitations, orthopnea, claudication, leg swelling and PND.  Gastrointestinal: Negative for abdominal pain, blood in stool, constipation, diarrhea, heartburn, melena, nausea and vomiting.  Genitourinary: Negative for dysuria, flank pain, frequency, hematuria and urgency.  Musculoskeletal: Negative for back  pain, falls, joint pain, myalgias and neck pain.  Skin: Negative for itching and rash.  Neurological: Negative for dizziness, tingling, tremors, sensory change, speech change, focal weakness, seizures, loss of consciousness, weakness and headaches.  Endo/Heme/Allergies: Negative for environmental allergies and polydipsia. Does not bruise/bleed easily.  Psychiatric/Behavioral: Negative for depression, hallucinations, memory loss, substance abuse and suicidal ideas. The patient is not nervous/anxious and does not have insomnia.       Physical Exam: BP 96/62   Pulse 64   Temp (!) 97.4 F (36.3 C)   Resp 16   Ht 5\' 2"  (1.575 m)   Wt 112 lb 6.4 oz (51 kg)   BMI 20.56 kg/m  Wt Readings from Last 3 Encounters:  08/22/19 112 lb 6.4 oz (51 kg)  04/18/19 120 lb (54.4 kg)  12/20/18 118 lb 9.6 oz (53.8 kg)   General Appearance: Well nourished, in no apparent distress. Eyes: PERRLA,  EOMs, conjunctiva no swelling or erythema Sinuses: No Frontal/maxillary tenderness ENT/Mouth: Ext aud canals clear, TMs without erythema, bulging. No erythema, swelling, or exudate on post pharynx.  Tonsils not swollen or erythematous. Hearing normal.  Neck: Supple, thyroid normal.  Respiratory: Respiratory effort normal, BS equal bilaterally without rales, rhonchi, wheezing or stridor.  Cardio: RRR with no MRGs. Brisk peripheral pulses without edema.  Abdomen: Soft, + BS.  Non tender, no guarding, rebound, hernias, masses. Lymphatics: Non tender without lymphadenopathy.  Musculoskeletal: Full ROM, 5/5 strength, Normal gait Skin: Warm, dry without rashes, lesions, ecchymosis.  Neuro: Cranial nerves intact. No cerebellar symptoms.  Psych: Awake and oriented X 3, normal affect, Insight and Judgment appropriate.    Garnet Sierras, NP [facility] 12:55 PM

## 2019-08-26 NOTE — Patient Instructions (Signed)
declined

## 2019-10-03 ENCOUNTER — Encounter: Payer: 59 | Admitting: Adult Health Nurse Practitioner

## 2019-10-21 ENCOUNTER — Telehealth: Payer: Self-pay | Admitting: Adult Health Nurse Practitioner

## 2019-10-21 NOTE — Progress Notes (Signed)
COMPLETE PHYSICAL   Assessment and Plan:   Evelyn Martinez was seen today for annual exam.  Diagnoses and all orders for this visit:  Encounter for annual physical exam Yearly  Elevated BP without diagnosis of hypertension -     EKG 12-Lead - NSR Monitor blood pressure at home; call if consistently over 130/80 Continue DASH diet.   Reminder to go to the ER if any CP, SOB, nausea, dizziness, severe HA, changes vision/speech, left arm numbness and tingling and jaw pain.   Lipid screening Discussed dietary and exercise modifications -     Lipid panel  Vitamin D deficiency Discussed dietary and exercise modifications -     VITAMIN D 25 Hydroxy (Vit-D Deficiency, Fractures)  Abnormal glucose Discussed dietary and exercise modifications -     Hemoglobin A1c  Screening for blood or protein in urine -     Urinalysis w microscopic + reflex cultur -     Urine Culture -     REFLEXIVE URINE CULTURE  Need for hepatitis C screening test Discussed with patient, agrees to screening -     Hepatitis C antibody  Need for Tdap vaccination -     Tdap vaccine greater than or equal to 7yo IM Discussed with patient  Medication management -Continued   Continue diet and meds as discussed. Further disposition pending results of labs. Discussed med's effects and SE's.  Patient agrees with plan of care and opportunity to ask questions/voice concerns. Over 30 minutes of chart review, interview, exam, counseling, and critical decision making was performed.   Future Appointments  Date Time Provider [place]  05/05/2020 11:30 AM Garnet Sierras, NP [facility] None  11/03/2020 10:00 AM Ashaki Frosch, Danton Sewer, NP [facility] None    ----------------------------------------------------------------------------------------------------------------------  HPI 64 y.o. female  presents for 3 month follow up on elevated blood pressure w/o dx of htn, history of acquired hypothyroidism(no medications),  history of pre-diabetes, weight and vitamin D deficiency.    She reports she was taking azithromycin and complete this,.  She ended up going to urgent care because the coughing.  Her chest x-ray was clear.  She report she got this from a family vacation from grandchildren.  She has also had to COVID-19 tests, that were negative.  She has previously completed the vaccination series 3/32/21.  She has a semi productive cough that is lingering.  She has not been taking anything for this.  She has not been exercising the past couple days ash she has not felt up to it with the cough. Otherwise she has not health or medication concerns today.  On 08/30/19 she was seen by dermatology for mohs, basal cell carcinoma of the scalp.  BMI is Body mass index is 21.01 kg/m., she has been working on diet and exercise. Wt Readings from Last 3 Encounters:  10/31/19 113 lb (51.3 kg)  08/22/19 112 lb 6.4 oz (51 kg)  04/18/19 120 lb (54.4 kg)   Her blood pressure has been controlled at home, today their BP is BP: 110/72  She does workout. She denies any cardiac symptoms, chest pains, palpitations, shortness of breath, dizziness or lower extremity edema.     She is not on cholesterol medication  Her cholesterol is at goal. The cholesterol last visit was:   Lab Results  Component Value Date   CHOL 187 10/31/2019   HDL 87 10/31/2019   LDLCALC 88 10/31/2019   TRIG 42 10/31/2019   CHOLHDL 2.1 10/31/2019    She has been working on diet  and exercise for prediabetes, and denies nausea, paresthesia of the feet, polydipsia, polyuria, visual disturbances, vomiting and weight loss. Last A1C in the office was:  Lab Results  Component Value Date   HGBA1C 5.5 10/31/2019   Patient is on Vitamin D supplement.   Lab Results  Component Value Date   VD25OH 62 10/31/2019       Current Medications:  Current Outpatient Medications on File Prior to Visit  Medication Sig  . albuterol (PROAIR HFA) 108 (90 Base) MCG/ACT  inhaler INHALE 1 TO 2 PUFFS EVERY 4 TO 6 HOURS AS NEEDED  . Ascorbic Acid (VITAMIN C PO) Take 2,000 mg by mouth.   . Cyanocobalamin (VITAMIN B-12 SL) Place 1 tablet under the tongue daily.  Marland Kitchen ELDERBERRY PO Take by mouth. Takes 2 gummies daily  . fluticasone (FLONASE) 50 MCG/ACT nasal spray Place into the nose.  . levothyroxine (SYNTHROID) 25 MCG tablet Take 1 tablet daily on an empty stomach with only water for 30 minutes & no Antacid meds, Calcium or Magnesium for 4 hours & avoid Biotin  . MAGNESIUM MALATE PO Take by mouth. Takes 1 tablet daily.  . montelukast (SINGULAIR) 10 MG tablet Take 1 tablet Daily for Allergies  . TURMERIC PO Take 1 tablet by mouth daily.  Marland Kitchen VITAMIN D PO Take 5,000 Units by mouth. Takes 4 times a week  . VITAMIN K PO Take by mouth. Takes 2 to 3 times a week  . zinc gluconate 50 MG tablet Take 50 mg by mouth daily.   No current facility-administered medications on file prior to visit.    Allergies:  Allergies  Allergen Reactions  . Epinephrine Palpitations    Increased HR   . Bactrim [Sulfamethoxazole-Trimethoprim] Hives  . Sulfamethoxazole Rash     Medical History:  Past Medical History:  Diagnosis Date  . Cancer ([place])    basel cell  . Stroke ([place]) 09/21/2017   TIA ( pt states under a lot of anxiety and stress);went home from [place]   . Thyroid disease     Family history- Reviewed and unchanged   Social history- Reviewed and unchanged   Names of Other Physician/Practitioners you currently use: 1. [facility] here for primary care 2. Eye Exam: 2021 3. Dental Exam Every 6 months UTD 2021  Patient Care Team: Unk Pinto, MD as PCP - General (Internal Medicine)   Screening Tests: Immunization History  Administered Date(s) Administered  . PFIZER SARS-COV-2 Vaccination 06/04/2019, 07/02/2019  . PPD Test 12/20/2018  . Tdap 05/01/2009, 10/31/2019  . Zoster 12/02/2013     Vaccinations: TD or Tdap:  05/01/2009  Influenza: Due for 2021 Pneumococcal: at age 47, discussed with patient Shingles: Zostavax/Shingrix: 11/2013   Preventative Care: Last colonoscopy: 01/2018 DEXA: 09/07/16 - managed with wom[facility] health. Hep C screening ([id number]): Today Mammogram: 08/22/18 Pap: 08/22/18 WNL -HPV   Review of Systems:  Review of Systems  Constitutional: Negative for chills, diaphoresis, fever, malaise/fatigue and weight loss.  HENT: Negative for congestion, ear discharge, ear pain, hearing loss, nosebleeds, sinus pain, sore throat and tinnitus.   Eyes: Negative for blurred vision, double vision, photophobia, pain, discharge and redness.  Respiratory: Negative for cough, hemoptysis, sputum production, shortness of breath, wheezing and stridor.   Cardiovascular: Negative for chest pain, palpitations, orthopnea, claudication, leg swelling and PND.  Gastrointestinal: Negative for abdominal pain, blood in stool, constipation, diarrhea, heartburn, melena, nausea and vomiting.  Genitourinary: Negative for dysuria, flank pain, frequency, hematuria and urgency.  Musculoskeletal:  Negative for back pain, falls, joint pain, myalgias and neck pain.  Skin: Negative for itching and rash.  Neurological: Negative for dizziness, tingling, tremors, sensory change, speech change, focal weakness, seizures, loss of consciousness, weakness and headaches.  Endo/Heme/Allergies: Negative for environmental allergies and polydipsia. Does not bruise/bleed easily.  Psychiatric/Behavioral: Negative for depression, hallucinations, memory loss, substance abuse and suicidal ideas. The patient is not nervous/anxious and does not have insomnia.       Physical Exam: BP 110/72   Pulse 64   Temp 97.6 F (36.4 C)   Resp 16   Ht 5' 1.5" (1.562 m)   Wt 113 lb (51.3 kg)   BMI 21.01 kg/m  Wt Readings from Last 3 Encounters:  10/31/19 113 lb (51.3 kg)  08/22/19 112 lb 6.4 oz (51 kg)  04/18/19 120 lb (54.4 kg)   General  Appearance: Well nourished, in no apparent distress. Eyes: PERRLA, EOMs, conjunctiva no swelling or erythema Sinuses: No Frontal/maxillary tenderness ENT/Mouth: Ext aud canals clear, TMs without erythema, bulging. No erythema, swelling, or exudate on post pharynx.  Tonsils not swollen or erythematous. Hearing normal.  Neck: Supple, thyroid normal.  Respiratory: Respiratory effort normal, BS equal bilaterally without rales, rhonchi, wheezing or stridor.  Cardio: RRR with no MRGs. Brisk peripheral pulses without edema.  Abdomen: Soft, + BS.  Non tender, no guarding, rebound, hernias, masses. Lymphatics: Non tender without lymphadenopathy.  Musculoskeletal: Full ROM, 5/5 strength, Normal gait Skin: Warm, dry without rashes, lesions, ecchymosis.  Neuro: Cranial nerves intact. No cerebellar symptoms.  Psych: Awake and oriented X 3, normal affect, Insight and Judgment appropriate.    Garnet Sierras, NP [facility] 11:45 AM

## 2019-10-21 NOTE — Telephone Encounter (Signed)
patient called to inform PCP, she was seen at an Urgent Care this weekend, cxr + PNE , - negative for covid 19. abx given for diagnosis of PNE. Asked that patient call our office if not improving in the next 3 days or symptoms worsen  for re evaluation.

## 2019-10-22 ENCOUNTER — Encounter: Payer: 59 | Admitting: Adult Health Nurse Practitioner

## 2019-10-31 ENCOUNTER — Ambulatory Visit (INDEPENDENT_AMBULATORY_CARE_PROVIDER_SITE_OTHER): Payer: 59 | Admitting: Adult Health Nurse Practitioner

## 2019-10-31 ENCOUNTER — Other Ambulatory Visit: Payer: Self-pay

## 2019-10-31 VITALS — BP 110/72 | HR 64 | Temp 97.6°F | Resp 16 | Ht 61.5 in | Wt 113.0 lb

## 2019-10-31 DIAGNOSIS — Z1329 Encounter for screening for other suspected endocrine disorder: Secondary | ICD-10-CM

## 2019-10-31 DIAGNOSIS — E559 Vitamin D deficiency, unspecified: Secondary | ICD-10-CM | POA: Diagnosis not present

## 2019-10-31 DIAGNOSIS — Z7185 Encounter for immunization safety counseling: Secondary | ICD-10-CM

## 2019-10-31 DIAGNOSIS — R7309 Other abnormal glucose: Secondary | ICD-10-CM

## 2019-10-31 DIAGNOSIS — Z23 Encounter for immunization: Secondary | ICD-10-CM | POA: Diagnosis not present

## 2019-10-31 DIAGNOSIS — R0989 Other specified symptoms and signs involving the circulatory and respiratory systems: Secondary | ICD-10-CM | POA: Diagnosis not present

## 2019-10-31 DIAGNOSIS — Z79899 Other long term (current) drug therapy: Secondary | ICD-10-CM

## 2019-10-31 DIAGNOSIS — Z136 Encounter for screening for cardiovascular disorders: Secondary | ICD-10-CM

## 2019-10-31 DIAGNOSIS — Z0000 Encounter for general adult medical examination without abnormal findings: Secondary | ICD-10-CM | POA: Diagnosis not present

## 2019-10-31 DIAGNOSIS — Z1159 Encounter for screening for other viral diseases: Secondary | ICD-10-CM | POA: Diagnosis not present

## 2019-10-31 DIAGNOSIS — Z13220 Encounter for screening for lipoid disorders: Secondary | ICD-10-CM | POA: Diagnosis not present

## 2019-10-31 DIAGNOSIS — Z131 Encounter for screening for diabetes mellitus: Secondary | ICD-10-CM | POA: Diagnosis not present

## 2019-10-31 DIAGNOSIS — R030 Elevated blood-pressure reading, without diagnosis of hypertension: Secondary | ICD-10-CM

## 2019-10-31 DIAGNOSIS — E039 Hypothyroidism, unspecified: Secondary | ICD-10-CM

## 2019-10-31 DIAGNOSIS — Z1389 Encounter for screening for other disorder: Secondary | ICD-10-CM

## 2019-11-02 ENCOUNTER — Encounter: Payer: Self-pay | Admitting: Adult Health Nurse Practitioner

## 2019-11-02 LAB — COMPLETE METABOLIC PANEL WITH GFR
AG Ratio: 1.6 (calc) (ref 1.0–2.5)
ALT: 19 U/L (ref 6–29)
AST: 32 U/L (ref 10–35)
Albumin: 4.3 g/dL (ref 3.6–5.1)
Alkaline phosphatase (APISO): 54 U/L (ref 37–153)
BUN: 15 mg/dL (ref 7–25)
CO2: 31 mmol/L (ref 20–32)
Calcium: 9.6 mg/dL (ref 8.6–10.4)
Chloride: 102 mmol/L (ref 98–110)
Creat: 0.77 mg/dL (ref 0.50–0.99)
GFR, Est African American: 95 mL/min/{1.73_m2} (ref >=60)
GFR, Est Non African American: 82 mL/min/{1.73_m2} (ref >=60)
Globulin: 2.7 g/dL (calc) (ref 1.9–3.7)
Glucose, Bld: 97 mg/dL (ref 65–99)
Potassium: 4.2 mmol/L (ref 3.5–5.3)
Sodium: 140 mmol/L (ref 135–146)
Total Bilirubin: 0.6 mg/dL (ref 0.2–1.2)
Total Protein: 7 g/dL (ref 6.1–8.1)

## 2019-11-02 LAB — URINALYSIS W MICROSCOPIC + REFLEX CULTURE
Bacteria, UA: NONE SEEN /HPF
Bilirubin Urine: NEGATIVE
Glucose, UA: NEGATIVE
Hgb urine dipstick: NEGATIVE
Hyaline Cast: NONE SEEN /LPF
Ketones, ur: NEGATIVE
Nitrites, Initial: NEGATIVE
Protein, ur: NEGATIVE
RBC / HPF: NONE SEEN /HPF (ref 0–2)
Specific Gravity, Urine: 1.015 (ref 1.001–1.03)
Squamous Epithelial / HPF: NONE SEEN /HPF (ref <=5)
WBC, UA: NONE SEEN /HPF (ref 0–5)
pH: 6.5 (ref 5.0–8.0)

## 2019-11-02 LAB — CBC WITH DIFFERENTIAL/PLATELET
Absolute Monocytes: 494 cells/uL (ref 200–950)
Basophils Absolute: 71 cells/uL (ref 0–200)
Basophils Relative: 1.5 %
Eosinophils Absolute: 202 cells/uL (ref 15–500)
Eosinophils Relative: 4.3 %
HCT: 41.6 % (ref 35.0–45.0)
Hemoglobin: 13.7 g/dL (ref 11.7–15.5)
Lymphs Abs: 1833 cells/uL (ref 850–3900)
MCH: 31.4 pg (ref 27.0–33.0)
MCHC: 32.9 g/dL (ref 32.0–36.0)
MCV: 95.2 fL (ref 80.0–100.0)
MPV: 10.2 fL (ref 7.5–12.5)
Monocytes Relative: 10.5 %
Neutro Abs: 2101 cells/uL (ref 1500–7800)
Neutrophils Relative %: 44.7 %
Platelets: 344 10*3/uL (ref 140–400)
RBC: 4.37 10*6/uL (ref 3.80–5.10)
RDW: 12.7 % (ref 11.0–15.0)
Total Lymphocyte: 39 %
WBC: 4.7 10*3/uL (ref 3.8–10.8)

## 2019-11-02 LAB — LIPID PANEL
Cholesterol: 187 mg/dL (ref <200)
HDL: 87 mg/dL (ref >=50)
LDL Cholesterol (Calc): 88 mg/dL (calc)
Non-HDL Cholesterol (Calc): 100 mg/dL (calc) (ref <130)
Total CHOL/HDL Ratio: 2.1 (calc) (ref <5.0)
Triglycerides: 42 mg/dL (ref <150)

## 2019-11-02 LAB — URINE CULTURE
MICRO NUMBER:: 10849903
SPECIMEN QUALITY:: ADEQUATE

## 2019-11-02 LAB — HEPATITIS C ANTIBODY
Hepatitis C Ab: NONREACTIVE
SIGNAL TO CUT-OFF: 0.02 (ref <1.00)

## 2019-11-02 LAB — HEMOGLOBIN A1C
Hgb A1c MFr Bld: 5.5 % of total Hgb (ref <5.7)
Mean Plasma Glucose: 111 (calc)
eAG (mmol/L): 6.2 (calc)

## 2019-11-02 LAB — TSH: TSH: 2.85 mIU/L (ref 0.40–4.50)

## 2019-11-02 LAB — VITAMIN D 25 HYDROXY (VIT D DEFICIENCY, FRACTURES): Vit D, 25-Hydroxy: 62 ng/mL (ref 30–100)

## 2019-11-02 LAB — CULTURE INDICATED

## 2019-11-20 ENCOUNTER — Other Ambulatory Visit: Payer: Self-pay | Admitting: Internal Medicine

## 2019-11-27 ENCOUNTER — Other Ambulatory Visit: Payer: Self-pay | Admitting: Internal Medicine

## 2019-11-27 MED ORDER — VALACYCLOVIR HCL 1 G PO TABS: ORAL_TABLET | ORAL | 3 refills | Status: AC

## 2019-11-28 ENCOUNTER — Other Ambulatory Visit: Payer: Self-pay | Admitting: Adult Health Nurse Practitioner

## 2019-11-28 DIAGNOSIS — Z20822 Contact with and (suspected) exposure to covid-19: Secondary | ICD-10-CM

## 2020-02-17 ENCOUNTER — Other Ambulatory Visit: Payer: Self-pay | Admitting: Internal Medicine

## 2020-03-03 ENCOUNTER — Encounter: Payer: 59 | Admitting: Adult Health Nurse Practitioner

## 2020-04-07 ENCOUNTER — Other Ambulatory Visit: Payer: Self-pay | Admitting: Internal Medicine

## 2020-04-17 LAB — HM MAMMOGRAPHY

## 2020-04-29 ENCOUNTER — Encounter: Payer: Self-pay | Admitting: Internal Medicine

## 2020-05-05 ENCOUNTER — Ambulatory Visit: Payer: 59 | Admitting: Adult Health Nurse Practitioner

## 2020-05-11 ENCOUNTER — Other Ambulatory Visit: Payer: Self-pay | Admitting: Internal Medicine

## 2020-05-11 ENCOUNTER — Other Ambulatory Visit: Payer: Self-pay | Admitting: Adult Health Nurse Practitioner

## 2020-05-11 DIAGNOSIS — E039 Hypothyroidism, unspecified: Secondary | ICD-10-CM

## 2020-05-11 DIAGNOSIS — Z79899 Other long term (current) drug therapy: Secondary | ICD-10-CM

## 2020-05-18 ENCOUNTER — Encounter: Payer: Self-pay | Admitting: Internal Medicine

## 2020-05-20 ENCOUNTER — Other Ambulatory Visit: Payer: Self-pay

## 2020-05-20 ENCOUNTER — Encounter: Payer: Self-pay | Admitting: Adult Health Nurse Practitioner

## 2020-05-20 ENCOUNTER — Ambulatory Visit: Payer: 59 | Admitting: Adult Health Nurse Practitioner

## 2020-05-20 VITALS — BP 110/76 | HR 65 | Temp 97.6°F | Ht 61.5 in | Wt 112.0 lb

## 2020-05-20 DIAGNOSIS — R7309 Other abnormal glucose: Secondary | ICD-10-CM | POA: Diagnosis not present

## 2020-05-20 DIAGNOSIS — E039 Hypothyroidism, unspecified: Secondary | ICD-10-CM | POA: Diagnosis not present

## 2020-05-20 DIAGNOSIS — E559 Vitamin D deficiency, unspecified: Secondary | ICD-10-CM

## 2020-05-20 DIAGNOSIS — R030 Elevated blood-pressure reading, without diagnosis of hypertension: Secondary | ICD-10-CM

## 2020-05-20 DIAGNOSIS — Z79899 Other long term (current) drug therapy: Secondary | ICD-10-CM

## 2020-05-20 MED ORDER — LEVOTHYROXINE SODIUM 25 MCG PO TABS: ORAL_TABLET | ORAL | 3 refills | Status: DC

## 2020-05-20 NOTE — Progress Notes (Signed)
FOLLOW UP 6 MONTH   Assessment and Plan:   Evelyn Martinez was seen today for annual exam.  Diagnoses and all orders for this visit:  Acquired hypothyroidism -     TSH -     levothyroxine (SYNTHROID) 25 MCG tablet; Take one tablet by mouth 81min prior to first meal with full glass of water.  Elevated BP without diagnosis of hypertension Controlled today Monitor blood pressure at home; call if consistently over 130/80 Continue DASH diet.   Reminder to go to the ER if any CP, SOB, nausea, dizziness, severe HA, changes vision/speech, left arm numbness and tingling and jaw pain.  Vitamin D deficiency Discussed dietary and exercise modifications -     VITAMIN D 25 Hydroxy (Vit-D Deficiency, Fractures)  Abnormal glucose Discussed dietary and exercise modifications -     Hemoglobin A1c  Medication management -Continued   Continue diet and meds as discussed. Further disposition pending results of labs. Discussed med's effects and SE's.  Patient agrees with plan of care and opportunity to ask questions/voice concerns. Over 30 minutes of chart review, interview, exam, counseling, and critical decision making was performed.   Future Appointments  Date Time Provider [place]  11/03/2020 10:00 AM Garnet Sierras, NP [facility] None    ----------------------------------------------------------------------------------------------------------------------  HPI 65 y.o. female  presents for 3 month follow up on elevated blood pressure w/o dx of htn, history of acquired hypothyroidism(no medications), history of pre-diabetes, weight and vitamin D deficiency.    She reports end of January she had COVID, mild sinus symptoms.  Had [employer] one week ago.  She has not health or medication concerns today.  On 08/30/19 she was seen by dermatology for mohs, basal cell carcinoma of the scalp.  BMI is Body mass index is 20.82 kg/m., she has been working on diet and exercise. Wt  Readings from Last 3 Encounters:  05/20/20 112 lb (50.8 kg)  10/31/19 113 lb (51.3 kg)  08/22/19 112 lb 6.4 oz (51 kg)   Her blood pressure has been controlled at home, today their BP is BP: 110/76  She does workout. She denies any cardiac symptoms, chest pains, palpitations, shortness of breath, dizziness or lower extremity edema.     She is not on cholesterol medication  Her cholesterol is at goal. The cholesterol last visit was:   Lab Results  Component Value Date   CHOL 187 10/31/2019   HDL 87 10/31/2019   LDLCALC 88 10/31/2019   TRIG 42 10/31/2019   CHOLHDL 2.1 10/31/2019    She has been working on diet and exercise for prediabetes, and denies nausea, paresthesia of the feet, polydipsia, polyuria, visual disturbances, vomiting and weight loss. Last A1C in the office was:  Lab Results  Component Value Date   HGBA1C 5.5 10/31/2019   Patient is on Vitamin D supplement.   Lab Results  Component Value Date   VD25OH 62 10/31/2019       Current Medications:  Current Outpatient Medications on File Prior to Visit  Medication Sig  . albuterol (VENTOLIN HFA) 108 (90 Base) MCG/ACT inhaler Use 2 inhalations  15 minutes apart  every 4 hours  as Needed to Rescue Asthma  . Ascorbic Acid (VITAMIN C PO) Take 2,000 mg by mouth.   . Cyanocobalamin (VITAMIN B-12 SL) Place 1 tablet under the tongue daily.  Marland Kitchen ELDERBERRY PO Take by mouth. Takes 2 gummies daily  . fluticasone (FLONASE) 50 MCG/ACT nasal spray Place into the nose.  . levothyroxine (SYNTHROID) 25 MCG  tablet TAKE 1 TABLET DAILY ON AN EMPTY STOMACH WITH ONLY WATER FOR 30 MINUTES & NO ANTACID MEDS, CALCIUM OR MAGNESIUM FOR 4 HOURS & AVOID BIOTIN  . MAGNESIUM MALATE PO Take by mouth. Takes 1 tablet daily.  . montelukast (SINGULAIR) 10 MG tablet TAKE 1 TABLET BY MOUTH DAILY FOR ALLERGIES  . TURMERIC PO Take 1 tablet by mouth daily.  . valACYclovir (VALTREX) 1000 MG tablet Take     1 tablet    Daily     for "Fever Blister" Prophylaxis   . VITAMIN D PO Take 5,000 Units by mouth. Takes 4 times a week  . VITAMIN K PO Take by mouth. Takes 2 to 3 times a week  . zinc gluconate 50 MG tablet Take 50 mg by mouth daily.   No current facility-administered medications on file prior to visit.    Allergies:  Allergies  Allergen Reactions  . Epinephrine Palpitations    Increased HR   . Bactrim [Sulfamethoxazole-Trimethoprim] Hives  . Sulfamethoxazole Rash     Medical History:  Past Medical History:  Diagnosis Date  . Cancer ([place])    basel cell  . Stroke ([place]) 09/21/2017   TIA ( pt states under a lot of anxiety and stress);went home from [place]   . Thyroid disease     Family history- Reviewed and unchanged   Social history- Reviewed and unchanged   Names of Other Physician/Practitioners you currently use: 1.  [facility] for [facility] 2. Eye Exam: 2021 3. Dental Exam Every 6 months UTD 2021  Patient Care Team: Unk Pinto, MD as PCP - General (Internal Medicine)   Screening Tests: Immunization History  Administered Date(s) Administered  . PFIZER Comirnaty([place])Covid-19 Tri-Sucrose Vaccine 05/11/2020  . PFIZER([place])SARS-COV-2 Vaccination 06/04/2019, 07/02/2019, 05/11/2020  . PPD Test 12/20/2018  . Tdap 05/01/2009, 10/31/2019  . Zoster 12/02/2013     Vaccinations: TD or Tdap: 10/2019  Influenza: Due for 2022 Pneumococcal: at age 76, discussed with patient Shingles: Zostavax/Shingrix: 11/2013 Discussed Shingrix   Preventative Care: Last colonoscopy: 01/2018 DEXA: 09/07/16 - managed with wom[facility] health. Hep C screening (1945-1965): 10/31/19 Mammogram: 04/2020 Pap: 08/22/18 WNL -HPV   Review of Systems:  Review of Systems  Constitutional: Negative for chills, diaphoresis, fever, malaise/fatigue and weight loss.  HENT: Negative for congestion, ear discharge, ear pain, hearing loss, nosebleeds, sinus pain, sore throat and tinnitus.   Eyes:  Negative for blurred vision, double vision, photophobia, pain, discharge and redness.  Respiratory: Negative for cough, hemoptysis, sputum production, shortness of breath, wheezing and stridor.   Cardiovascular: Negative for chest pain, palpitations, orthopnea, claudication, leg swelling and PND.  Gastrointestinal: Negative for abdominal pain, blood in stool, constipation, diarrhea, heartburn, melena, nausea and vomiting.  Genitourinary: Negative for dysuria, flank pain, frequency, hematuria and urgency.  Musculoskeletal: Negative for back pain, falls, joint pain, myalgias and neck pain.  Skin: Negative for itching and rash.  Neurological: Negative for dizziness, tingling, tremors, sensory change, speech change, focal weakness, seizures, loss of consciousness, weakness and headaches.  Endo/Heme/Allergies: Negative for environmental allergies and polydipsia. Does not bruise/bleed easily.  Psychiatric/Behavioral: Negative for depression, hallucinations, memory loss, substance abuse and suicidal ideas. The patient is not nervous/anxious and does not have insomnia.       Physical Exam: BP 110/76   Pulse 65   Temp 97.6 F (36.4 C)   Ht 5' 1.5" (1.562 m)   Wt 112 lb (50.8 kg)   SpO2 99%   BMI 20.82  kg/m  Wt Readings from Last 3 Encounters:  05/20/20 112 lb (50.8 kg)  10/31/19 113 lb (51.3 kg)  08/22/19 112 lb 6.4 oz (51 kg)   General Appearance: Well nourished, in no apparent distress. Eyes: PERRLA, EOMs, conjunctiva no swelling or erythema Sinuses: No Frontal/maxillary tenderness ENT/Mouth: Ext aud canals clear, TMs without erythema, bulging. No erythema, swelling, or exudate on post pharynx.  Tonsils not swollen or erythematous. Hearing normal.  Neck: Supple, thyroid normal.  Respiratory: Respiratory effort normal, BS equal bilaterally without rales, rhonchi, wheezing or stridor.  Cardio: RRR with no MRGs. Brisk peripheral pulses without edema.  Abdomen: Soft, + BS.  Non tender, no  guarding, rebound, hernias, masses. Lymphatics: Non tender without lymphadenopathy.  Musculoskeletal: Full ROM, 5/5 strength, Normal gait Skin: Warm, dry without rashes, lesions, ecchymosis.  Neuro: Cranial nerves intact. No cerebellar symptoms.  Psych: Awake and oriented X 3, normal affect, Insight and Judgment appropriate.     Garnet Sierras, NP [facility] 11:45 AM

## 2020-05-21 LAB — CBC WITH DIFFERENTIAL/PLATELET
Absolute Monocytes: 624 cells/uL (ref 200–950)
Basophils Absolute: 62 cells/uL (ref 0–200)
Basophils Relative: 0.8 %
Eosinophils Absolute: 54 cells/uL (ref 15–500)
Eosinophils Relative: 0.7 %
HCT: 42.3 % (ref 35.0–45.0)
Hemoglobin: 14.3 g/dL (ref 11.7–15.5)
Lymphs Abs: 1902 cells/uL (ref 850–3900)
MCH: 32.1 pg (ref 27.0–33.0)
MCHC: 33.8 g/dL (ref 32.0–36.0)
MCV: 94.8 fL (ref 80.0–100.0)
MPV: 10.2 fL (ref 7.5–12.5)
Monocytes Relative: 8.1 %
Neutro Abs: 5059 cells/uL (ref 1500–7800)
Neutrophils Relative %: 65.7 %
Platelets: 364 10*3/uL (ref 140–400)
RBC: 4.46 10*6/uL (ref 3.80–5.10)
RDW: 13.4 % (ref 11.0–15.0)
Total Lymphocyte: 24.7 %
WBC: 7.7 10*3/uL (ref 3.8–10.8)

## 2020-05-21 LAB — COMPLETE METABOLIC PANEL WITH GFR
AG Ratio: 1.9 (calc) (ref 1.0–2.5)
ALT: 20 U/L (ref 6–29)
AST: 26 U/L (ref 10–35)
Albumin: 4.8 g/dL (ref 3.6–5.1)
Alkaline phosphatase (APISO): 51 U/L (ref 37–153)
BUN: 23 mg/dL (ref 7–25)
CO2: 28 mmol/L (ref 20–32)
Calcium: 10.3 mg/dL (ref 8.6–10.4)
Chloride: 99 mmol/L (ref 98–110)
Creat: 0.99 mg/dL (ref 0.50–0.99)
GFR, Est African American: 70 mL/min/{1.73_m2} (ref >=60)
GFR, Est Non African American: 60 mL/min/{1.73_m2} (ref >=60)
Globulin: 2.5 g/dL (calc) (ref 1.9–3.7)
Glucose, Bld: 96 mg/dL (ref 65–99)
Potassium: 4.9 mmol/L (ref 3.5–5.3)
Sodium: 139 mmol/L (ref 135–146)
Total Bilirubin: 0.6 mg/dL (ref 0.2–1.2)
Total Protein: 7.3 g/dL (ref 6.1–8.1)

## 2020-05-21 LAB — TSH: TSH: 4.15 mIU/L (ref 0.40–4.50)

## 2020-10-06 ENCOUNTER — Encounter: Payer: 59 | Admitting: Adult Health Nurse Practitioner

## 2020-10-27 ENCOUNTER — Encounter: Payer: 59 | Admitting: Adult Health Nurse Practitioner

## 2020-11-03 ENCOUNTER — Encounter: Payer: 59 | Admitting: Nurse Practitioner

## 2020-11-03 ENCOUNTER — Encounter: Payer: 59 | Admitting: Adult Health Nurse Practitioner

## 2020-11-06 NOTE — Progress Notes (Deleted)
COMPLETE PHYSICAL   Assessment and Plan:   Evelyn Martinez was seen today for annual exam.  Diagnoses and all orders for this visit:  Encounter for annual physical exam Yearly  Elevated BP without diagnosis of hypertension -     EKG 12-Lead - NSR Monitor blood pressure at home; call if consistently over 130/80 Continue DASH diet.   Reminder to go to the ER if any CP, SOB, nausea, dizziness, severe HA, changes vision/speech, left arm numbness and tingling and jaw pain.   Lipid screening Discussed dietary and exercise modifications -     Lipid panel  Vitamin D deficiency Discussed dietary and exercise modifications -     VITAMIN D 25 Hydroxy (Vit-D Deficiency, Fractures)  Abnormal glucose Discussed dietary and exercise modifications -     Hemoglobin A1c  Screening for blood or protein in urine -     Urinalysis w microscopic + reflex cultur -     Urine Culture -     REFLEXIVE URINE CULTURE  Need for hepatitis C screening test Discussed with patient, agrees to screening -     Hepatitis C antibody  Need for Tdap vaccination -     Tdap vaccine greater than or equal to 7yo IM Discussed with patient  Medication management -Continued   Continue diet and meds as discussed. Further disposition pending results of labs. Discussed med's effects and SE's.  Patient agrees with plan of care and opportunity to ask questions/voice concerns. Over 30 minutes of chart review, interview, exam, counseling, and critical decision making was performed.   Future Appointments  Date Time Provider [place]  11/09/2020  2:00 PM Magda Bernheim, NP [facility] None  11/09/2021  2:00 PM Magda Bernheim, NP [facility] None    ----------------------------------------------------------------------------------------------------------------------  HPI 65 y.o. female  presents for 3 month follow up on elevated blood pressure w/o dx of htn, history of acquired hypothyroidism(no medications), history of  pre-diabetes, weight and vitamin D deficiency.    She reports she was taking azithromycin and complete this,.  She ended up going to urgent care because the coughing.  Her chest x-ray was clear.  She report she got this from a family vacation from grandchildren.  She has also had to COVID-19 tests, that were negative.  She has previously completed the vaccination series 3/32/21.  She has a semi productive cough that is lingering.  She has not been taking anything for this.  She has not been exercising the past couple days ash she has not felt up to it with the cough. Otherwise she has not health or medication concerns today.  On 08/30/19 she was seen by dermatology for mohs, basal cell carcinoma of the scalp.  BMI is There is no height or weight on file to calculate BMI., she has been working on diet and exercise. Wt Readings from Last 3 Encounters:  05/20/20 112 lb (50.8 kg)  10/31/19 113 lb (51.3 kg)  08/22/19 112 lb 6.4 oz (51 kg)   Her blood pressure has been controlled at home, today their BP is    She does workout. She denies any cardiac symptoms, chest pains, palpitations, shortness of breath, dizziness or lower extremity edema.     She is not on cholesterol medication  Her cholesterol is at goal. The cholesterol last visit was:   Lab Results  Component Value Date   CHOL 187 10/31/2019   HDL 87 10/31/2019   LDLCALC 88 10/31/2019   TRIG 42 10/31/2019   CHOLHDL 2.1 10/31/2019  She has been working on diet and exercise for prediabetes, and denies nausea, paresthesia of the feet, polydipsia, polyuria, visual disturbances, vomiting and weight loss. Last A1C in the office was:  Lab Results  Component Value Date   HGBA1C 5.5 10/31/2019   Patient is on Vitamin D supplement.   Lab Results  Component Value Date   VD25OH 62 10/31/2019       Current Medications:  Current Outpatient Medications on File Prior to Visit  Medication Sig   albuterol (VENTOLIN HFA) 108 (90 Base) MCG/ACT  inhaler Use 2 inhalations  15 minutes apart  every 4 hours  as Needed to Rescue Asthma   Ascorbic Acid (VITAMIN C PO) Take 2,000 mg by mouth.    Cyanocobalamin (VITAMIN B-12 SL) Place 1 tablet under the tongue daily.   ELDERBERRY PO Take by mouth. Takes 2 gummies daily   fluticasone (FLONASE) 50 MCG/ACT nasal spray Place into the nose.   levothyroxine (SYNTHROID) 25 MCG tablet Take one tablet by mouth 7mn prior to first meal with full glass of water.   MAGNESIUM MALATE PO Take by mouth. Takes 1 tablet daily.   montelukast (SINGULAIR) 10 MG tablet TAKE 1 TABLET BY MOUTH DAILY FOR ALLERGIES   TURMERIC PO Take 1 tablet by mouth daily.   valACYclovir (VALTREX) 1000 MG tablet Take     1 tablet    Daily     for "Fever Blister" Prophylaxis   VITAMIN D PO Take 5,000 Units by mouth. Takes 4 times a week   VITAMIN K PO Take by mouth. Takes 2 to 3 times a week   zinc gluconate 50 MG tablet Take 50 mg by mouth daily.   No current facility-administered medications on file prior to visit.    Allergies:  Allergies  Allergen Reactions   Epinephrine Palpitations    Increased HR    Bactrim [Sulfamethoxazole-Trimethoprim] Hives   Sulfamethoxazole Rash     Medical History:  Past Medical History:  Diagnosis Date   [place]    basel cell   [place]   TIA ( pt states under a lot of anxiety and stress[place]   Thyroid disease     Family history- Reviewed and unchanged   Social history- Reviewed and unchanged   Names of Other Physician/Practitioners you currently use: 1. [facility] here for primary care 2. Eye Exam: 2021 3. Dental Exam Every 6 months UTD 2021  Patient Care Team: MUnk Pinto MD as PCP - General (Internal Medicine)   Screening Tests: Immunization History  Administered Date(s) Administered   PFIZER Comirnaty(Gray Top)Covid-19 Tri-Sucrose Vaccine 05/11/2020   PFIZER(Purple Top)SARS-COV-2  Vaccination 06/04/2019, 07/02/2019, 05/11/2020   PPD Test 12/20/2018   Tdap 05/01/2009, 10/31/2019   Zoster, Live 12/02/2013     Vaccinations: TD or Tdap: 10/31/19  Influenza: *** Pneumococcal: Due *** Shingles: Zostavax/Shingrix: 11/2013 Covid: 06/04/19, 07/02/19, 05/11/20 Pfizer  Preventative Care: Last colonoscopy: 01/2018 DEXA: 09/07/16 - managed with wom[facility]. Hep C screening (1945-1965): 10/31/19 Mammogram: 04/17/20 Pap: 08/22/18 WNL -HPV ***   Review of Systems:  Review of Systems  Constitutional:  Negative for chills, diaphoresis, fever, malaise/fatigue and weight loss.  HENT:  Negative for congestion, ear discharge, ear pain, hearing loss, nosebleeds, sinus pain, sore throat and tinnitus.   Eyes:  Negative for blurred vision, double vision, photophobia, pain, discharge and redness.  Respiratory:  Negative for cough, hemoptysis, sputum production, shortness of breath, wheezing and stridor.   Cardiovascular:  Negative for chest pain, palpitations, orthopnea, claudication, leg swelling and PND.  Gastrointestinal:  Negative for abdominal pain, blood in stool, constipation, diarrhea, heartburn, melena, nausea and vomiting.  Genitourinary:  Negative for dysuria, flank pain, frequency, hematuria and urgency.  Musculoskeletal:  Negative for back pain, falls, joint pain, myalgias and neck pain.  Skin:  Negative for itching and rash.  Neurological:  Negative for dizziness, tingling, tremors, sensory change, speech change, focal weakness, seizures, loss of consciousness, weakness and headaches.  Endo/Heme/Allergies:  Negative for environmental allergies and polydipsia. Does not bruise/bleed easily.  Psychiatric/Behavioral:  Negative for depression, hallucinations, memory loss, substance abuse and suicidal ideas. The patient is not nervous/anxious and does not have insomnia.      Physical Exam: There were no vitals taken for this visit. Wt Readings from Last 3 Encounters:  05/20/20  112 lb (50.8 kg)  10/31/19 113 lb (51.3 kg)  08/22/19 112 lb 6.4 oz (51 kg)   General Appearance: Well nourished, in no apparent distress. Eyes: PERRLA, EOMs, conjunctiva no swelling or erythema Sinuses: No Frontal/maxillary tenderness ENT/Mouth: Ext aud canals clear, TMs without erythema, bulging. No erythema, swelling, or exudate on post pharynx.  Tonsils not swollen or erythematous. Hearing normal.  Neck: Supple, thyroid normal.  Respiratory: Respiratory effort normal, BS equal bilaterally without rales, rhonchi, wheezing or stridor.  Cardio: RRR with no MRGs. Brisk peripheral pulses without edema.  Abdomen: Soft, + BS.  Non tender, no guarding, rebound, hernias, masses. Lymphatics: Non tender without lymphadenopathy.  Musculoskeletal: Full ROM, 5/5 strength, Normal gait Skin: Warm, dry without rashes, lesions, ecchymosis.  Neuro: Cranial nerves intact. No cerebellar symptoms.  Psych: Awake and oriented X 3, normal affect, Insight and Judgment appropriate.    Magda Bernheim, NP [facility] 11:45 AM

## 2020-11-09 ENCOUNTER — Encounter: Payer: PPO | Admitting: Nurse Practitioner

## 2020-11-18 ENCOUNTER — Emergency Department (HOSPITAL_COMMUNITY): Payer: PPO

## 2020-11-18 ENCOUNTER — Other Ambulatory Visit: Payer: Self-pay

## 2020-11-18 ENCOUNTER — Emergency Department (HOSPITAL_COMMUNITY)
Admission: EM | Admit: 2020-11-18 | Discharge: 2020-11-18 | Disposition: A | Discharge status: Home / Self Care | Payer: PPO | Attending: Emergency Medicine | Admitting: Emergency Medicine

## 2020-11-18 ENCOUNTER — Encounter (HOSPITAL_COMMUNITY): Payer: Self-pay

## 2020-11-18 DIAGNOSIS — E039 Hypothyroidism, unspecified: Secondary | ICD-10-CM | POA: Insufficient documentation

## 2020-11-18 DIAGNOSIS — Z7951 Long term (current) use of inhaled steroids: Secondary | ICD-10-CM | POA: Diagnosis not present

## 2020-11-18 DIAGNOSIS — S61210A Laceration without foreign body of right index finger without damage to nail, initial encounter: Secondary | ICD-10-CM | POA: Insufficient documentation

## 2020-11-18 DIAGNOSIS — J45909 Unspecified asthma, uncomplicated: Secondary | ICD-10-CM | POA: Diagnosis not present

## 2020-11-18 DIAGNOSIS — W231XXA Caught, crushed, jammed, or pinched between stationary objects, initial encounter: Secondary | ICD-10-CM | POA: Diagnosis not present

## 2020-11-18 DIAGNOSIS — Z79899 Other long term (current) drug therapy: Secondary | ICD-10-CM | POA: Insufficient documentation

## 2020-11-18 DIAGNOSIS — Z23 Encounter for immunization: Secondary | ICD-10-CM | POA: Diagnosis not present

## 2020-11-18 DIAGNOSIS — Z85828 Personal history of other malignant neoplasm of skin: Secondary | ICD-10-CM | POA: Diagnosis not present

## 2020-11-18 DIAGNOSIS — S6991XA Unspecified injury of right wrist, hand and finger(s), initial encounter: Secondary | ICD-10-CM | POA: Diagnosis present

## 2020-11-18 MED ORDER — OXYCODONE-ACETAMINOPHEN 5-325 MG PO TABS
1.0000 | ORAL_TABLET | Freq: Once | ORAL | Status: AC
  Administered 2020-11-18: 1 via ORAL
  Filled 2020-11-18: qty 1

## 2020-11-18 MED ORDER — BACITRACIN ZINC 500 UNIT/GM EX OINT
TOPICAL_OINTMENT | CUTANEOUS | Status: AC
  Filled 2020-11-18: qty 0.9

## 2020-11-18 MED ORDER — TETANUS-DIPHTH-ACELL PERTUSSIS 5-2.5-18.5 LF-MCG/0.5 IM SUSY
0.5000 mL | PREFILLED_SYRINGE | Freq: Once | INTRAMUSCULAR | Status: AC
  Administered 2020-11-18: 0.5 mL via INTRAMUSCULAR
  Filled 2020-11-18: qty 0.5

## 2020-11-18 MED ORDER — LIDOCAINE HCL (PF) 1 % IJ SOLN
10.0000 mL | Freq: Once | INTRAMUSCULAR | Status: AC
  Administered 2020-11-18: 10 mL
  Filled 2020-11-18: qty 30

## 2020-11-18 NOTE — ED Triage Notes (Signed)
Pt reports laceration to right index finger with an immersion blender. Finger is actively bleeding at this time.

## 2020-11-18 NOTE — Discharge Instructions (Addendum)
You came to the emergency department today to have your laceration evaluated.  Your x-ray showed no broken bones or dislocations.  Your laceration required stitches.  Please follow-up with your primary care provider and 10 to 14 days to have the stitches removed.  Due to your reports of decreased sensation I have given you information to follow-up with a hand specialist.  Please call their office tomorrow to schedule a follow-up appointment.  Please take Ibuprofen (Advil, motrin) and Tylenol (acetaminophen) to relieve your pain.    You may take up to 600 MG (3 pills) of normal strength ibuprofen every 8 hours as needed.   You make take tylenol, up to 1,000 mg (two extra strength pills) every 8 hours as needed.   It is safe to take ibuprofen and tylenol at the same time as they work differently.   Do not take more than 3,000 mg tylenol in a 24 hour period (not more than one dose every 8 hours.  Please check all medication labels as many medications such as pain and cold medications may contain tylenol.  Do not drink alcohol while taking these medications.  Do not take other NSAID'S while taking ibuprofen (such as aleve or naproxen).  Please take ibuprofen with food to decrease stomach upset.  Get help right away if: You develop severe swelling around the wound. Your pain suddenly increases and is severe. You develop painful lumps near the wound or on skin anywhere else on your body. You have a red streak going away from your wound. The wound is on your hand or foot, and you cannot properly move a finger or toe. The wound is on your hand or foot, and you notice that your fingers or toes look pale or bluish.

## 2020-11-18 NOTE — ED Notes (Signed)
Provider at pt bedside  

## 2020-11-18 NOTE — ED Provider Notes (Signed)
Emergency Medicine Provider Triage Evaluation Note  Evelyn Martinez , a 65 y.o. female  was evaluated in triage.  Pt complains of laceration to right index finger.  Reports that approximate 20 minutes prior her finger got caught in an immersion blender.  Patient reports decree sensation to affected digit.  Denies any weakness.  Patient is right-hand dominant.  Unsure when her last tetanus shot was.  Review of Systems  Positive: Wound, numbness Negative: Weakness, color change  Physical Exam  There were no vitals taken for this visit. Gen:   Awake, no distress   Resp:  Normal effort  MSK:   Moves extremities without difficulty  Other:  2 lacerations to distal tip of right index finger.  Patient has decree sensation to distal tip and ulnar aspect of right index finger.  Patient has full range of motion to affected digit.  Medical Decision Making  Medically screening exam initiated at 5:19 PM.  Appropriate orders placed.  Evelyn Martinez was informed that the remainder of the evaluation will be completed by another provider, this initial triage assessment does not replace that evaluation, and the importance of remaining in the ED until their evaluation is complete.  The patient appears stable so that the remainder of the work up may be completed by another provider.      Evelyn Martinez 11/18/20 1720    [name], MD 11/18/20 2146

## 2020-11-18 NOTE — ED Provider Notes (Signed)
[facility] DEPT Provider Note   CSN: [id number] Arrival date & time: 11/18/20  1705     History Chief Complaint  Patient presents with   Laceration    Evelyn Martinez is a 65 y.o. female presents with laceration to right index finger.  Laceration occurred just prior to arrival in the emergency department.  Patient reports that her finger got caught in an immersion blender.  Patient complains of pain to affected digit.  Patient rates pain 10/10 on the pain scale.  Pain is constant.  Pain is worse with movement or touch.  Patient reports decree sensation to affected digit.  Patient denies any weakness to affected digit.  Patient is right-hand dominant.  Patient unsure when her last tetanus shot was.   Laceration Associated symptoms: no rash       Past Medical History:  Diagnosis Date   Cancer ([place])    basel cell   Stroke ([place]) 09/21/2017   TIA ( pt states under a lot of anxiety and stress);went home from [place]    Thyroid disease     Patient Active Problem List   Diagnosis Date Noted   Acquired hypothyroidism 12/22/2018   Elevated BP without diagnosis of hypertension 12/22/2018   Lipid screening 12/22/2018   Abnormal glucose 12/22/2018   Vitamin D deficiency 12/22/2018   Situational depression 09/30/2015   Right hip pain 07/22/2014   Primary osteoarthritis of right hip 07/13/2014   Right carpal tunnel syndrome 03/12/2014   Herpes gingivostomatitis 12/20/2013   Allergic rhinitis 07/08/2010   Asthma 07/08/2010   Menstrual migraine 03/21/2007   Raynaud's phenomenon 03/21/2007    Past Surgical History:  Procedure Laterality Date   BREAST ENHANCEMENT SURGERY     x2   CARPAL TUNNEL RELEASE Bilateral    [place]   COLONOSCOPY  10 years ago    [place]=Normal exam per pt   NEURECTOMY FOOT  2017     OB History   No obstetric history on file.     Family History  Problem Relation Age of Onset   Uterine cancer  Mother    Diabetes Father    Diabetes Brother    Diabetes Sister    Diabetes Sister    Colon cancer Neg Hx    Colon polyps Neg Hx    Esophageal cancer Neg Hx    Rectal cancer Neg Hx    Stomach cancer Neg Hx     Social History   Tobacco Use   Smoking status: Never   Smokeless tobacco: Never  Vaping Use   Vaping Use: Never used  Substance Use Topics   Alcohol use: Yes    Alcohol/week: 14.0 standard drinks    Types: 14 Glasses of wine per week   Drug use: Not Currently    Home Medications Prior to Admission medications   Medication Sig Start Date End Date Taking? Authorizing Provider  albuterol (VENTOLIN HFA) 108 (90 Base) MCG/ACT inhaler Use 2 inhalations  15 minutes apart  every 4 hours  as Needed to Rescue Asthma 04/07/20   Unk Pinto, MD  Ascorbic Acid (VITAMIN C PO) Take 2,000 mg by mouth.     [provider]  Cyanocobalamin (VITAMIN B-12 SL) Place 1 tablet under the tongue daily.    [provider]  ELDERBERRY PO Take by mouth. Takes 2 gummies daily    [provider]  fluticasone (FLONASE) 50 MCG/ACT nasal spray Place into the nose. 04/17/13   [provider]  levothyroxine (SYNTHROID) 25 MCG tablet Take one tablet by mouth 100mn prior to first meal with full glass of water. 05/20/20   MGarnet Sierras NP  MAGNESIUM MALATE PO Take by mouth. Takes 1 tablet daily.    [provider]  montelukast (SINGULAIR) 10 MG tablet TAKE 1 TABLET BY MOUTH DAILY FOR ALLERGIES 05/11/20   CLiane Comber NP  TURMERIC PO Take 1 tablet by mouth daily.    [provider]  valACYclovir (VALTREX) 1000 MG tablet Take     1 tablet    Daily     for "Fever Blister" Prophylaxis 11/27/19   MUnk Pinto MD  VITAMIN D PO Take 5,000 Units by mouth. Takes 4 times a week    [provider]  VITAMIN K PO Take by mouth. Takes 2 to 3 times a week    [provider]  zinc gluconate 50 MG tablet Take 50 mg by mouth daily.     [provider]    Allergies    Epinephrine, Bactrim [sulfamethoxazole-trimethoprim], and Sulfamethoxazole  Review of Systems   Review of Systems  Skin:  Positive for wound. Negative for color change, pallor and rash.  Neurological:  Positive for numbness. Negative for weakness.   Physical Exam Updated Vital Signs BP (!) 142/85   Pulse 64   Temp 98 F (36.7 C) (Oral)   Resp 18   SpO2 100%   Physical Exam Vitals and nursing note reviewed.  Constitutional:      General: She is not in acute distress.    Appearance: She is not ill-appearing, toxic-appearing or diaphoretic.  HENT:     Head: Normocephalic.  Eyes:     General: No scleral icterus.       Right eye: No discharge.        Left eye: No discharge.  Cardiovascular:     Rate and Rhythm: Normal rate.     Pulses:          Radial pulses are 2+ on the right side.  Pulmonary:     Effort: Pulmonary effort is normal.  Musculoskeletal:     Right wrist: Normal.     Right hand: Tenderness present. No swelling, deformity, lacerations or bony tenderness. Normal range of motion. Decreased sensation.     Comments: 2cm and 1.5cm laceration to right index finger; lacerations with jagged edges and macerated skin.  Decreased sensation to distal tip and ulnar aspect of right index finger.  Patient has full range of motion to affected digit.  Skin:    General: Skin is warm and dry.  Neurological:     General: No focal deficit present.     Mental Status: She is alert.  Psychiatric:        Behavior: Behavior is cooperative.       ED Results / Procedures / Treatments   Labs (all labs ordered are listed, but only abnormal results are displayed) Labs Reviewed - No data to display  EKG None  Radiology DG Finger Index Right  Result Date: 11/18/2020 CLINICAL DATA:  Laceration right index finger. EXAM: RIGHT INDEX FINGER 2+V COMPARISON:  None. FINDINGS: Overlying bandage obscures fine detail. There is soft tissue swelling  of the ventral tip of the second phalanx. There is no definite radiopaque foreign body. No acute fracture identified. There are mild degenerative changes of the second distal interphalangeal joint with joint space narrowing and osteophyte formation. IMPRESSION: 1. Soft tissue swelling of the distal second finger. No radiopaque foreign body. 2.  No definite acute fracture or dislocation. Electronically Signed   By: Ronney Asters M.D.   On: 11/18/2020 18:14    Procedures .Marland KitchenLaceration Repair  Date/Time: 11/18/2020 11:31 PM Performed by: Loni Beckwith, PA-C Authorized by: Loni Beckwith, PA-C   Consent:    Consent obtained:  Verbal   Consent given by:  Patient   Risks discussed:  Infection, need for additional repair, pain, poor cosmetic result and poor wound healing   Alternatives discussed:  No treatment and delayed treatment Universal protocol:    Procedure explained and questions answered to patient or proxy's satisfaction: yes     Imaging studies available: yes     Immediately prior to procedure, a time out was called: yes     Patient identity confirmed:  Verbally with patient and arm band Anesthesia:    Anesthesia method:  Nerve block   Block location:  Digital   Block anesthetic:  Lidocaine 1% w/o epi   Block injection procedure:  Anatomic landmarks identified, anatomic landmarks palpated, introduced needle, negative aspiration for blood and incremental injection   Block outcome:  Anesthesia achieved Laceration details:    Location:  Finger   Finger location:  R index finger   Length (cm):  2   Depth (mm):  4 Exploration:    Hemostasis achieved with:  Tourniquet   Imaging obtained: x-ray     Imaging outcome: foreign body not noted     Wound exploration: entire depth of wound visualized     Wound extent: no foreign bodies/material noted   Treatment:    Area cleansed with:  Povidone-iodine   Amount of cleaning:  Extensive   Irrigation solution:  Sterile water    Irrigation method:  Syringe   Debridement:  Minimal Skin repair:    Repair method:  Sutures   Suture size:  5-0 and 3-0   Suture material:  Prolene   Number of sutures:  5 Approximation:    Approximation:  Loose Repair type:    Repair type:  Complex Post-procedure details:    Dressing:  Sterile dressing   Procedure completion:  Tolerated well, no immediate complications .Marland KitchenLaceration Repair  Date/Time: 11/18/2020 11:33 PM Performed by: Loni Beckwith, PA-C Authorized by: Loni Beckwith, PA-C   Consent:    Consent obtained:  Verbal   Consent given by:  Patient   Risks discussed:  Infection, need for additional repair, pain, poor cosmetic result and poor wound healing   Alternatives discussed:  No treatment and delayed treatment Universal protocol:    Procedure explained and questions answered to patient or proxy's satisfaction: yes     Imaging studies available: yes     Immediately prior to procedure, a time out was called: yes     Patient identity confirmed:  Verbally with patient and arm band Anesthesia:    Anesthesia method:  Nerve block   Block location:  Digital block   Block needle gauge:  24 G   Block anesthetic:  Lidocaine 1% w/o epi   Block injection procedure:  Anatomic landmarks identified, anatomic landmarks palpated, introduced needle, negative aspiration for blood and incremental injection Laceration details:    Location:  Finger   Finger location:  R index finger   Length (cm):  1.5   Depth (mm):  3 Pre-procedure details:    Preparation:  Patient was prepped and draped in usual sterile fashion and imaging obtained to evaluate for foreign bodies Exploration:    Hemostasis achieved with:  Tourniquet  Imaging obtained: x-ray     Imaging outcome: foreign body not noted     Wound extent: no foreign bodies/material noted   Treatment:    Area cleansed with:  Povidone-iodine   Amount of cleaning:  Extensive   Irrigation solution:  Sterile water    Irrigation method:  Syringe   Debridement:  Minimal Skin repair:    Repair method:  Sutures   Suture size:  3-0   Suture material:  Prolene   Suture technique:  Simple interrupted   Number of sutures:  4 Approximation:    Approximation:  Loose Repair type:    Repair type:  Complex Post-procedure details:    Dressing:  Sterile dressing   Procedure completion:  Tolerated well, no immediate complications   Medications Ordered in ED Medications - No data to display  ED Course  I have reviewed the triage vital signs and the nursing notes.  Pertinent labs & imaging results that were available during my care of the patient were reviewed by me and considered in my medical decision making (see chart for details).    MDM Rules/Calculators/A&P                           Alert 65 year old female no acute distress, nontoxic appearing.  Presents with chief complaint of laceration to right index finger.  Hemorrhage controlled with finger tourniquet.  Patient has full range of motion to affected digit.  Decreased sensation to distal tip and ulnar aspect.  X-ray imaging obtained shows no acute osseous abnormality.  Suture procedure performed as noted above.  Patient to have sutures removed in 10 to 14 days.  Patient given information to follow-up with hand specialist.  Discussed results, findings, treatment and follow up. Patient advised of return precautions. Patient verbalized understanding and agreed with plan.   Final Clinical Impression(s) / ED Diagnoses Final diagnoses:  Laceration of right index finger without foreign body without damage to nail, initial encounter    Rx / DC Orders ED Discharge Orders     None        [name] 11/18/20 2335    [name], MD 11/19/20 [phone]

## 2020-11-20 DIAGNOSIS — S61310A Laceration without foreign body of right index finger with damage to nail, initial encounter: Secondary | ICD-10-CM | POA: Diagnosis not present

## 2020-11-20 DIAGNOSIS — M79641 Pain in right hand: Secondary | ICD-10-CM | POA: Diagnosis not present

## 2020-11-27 DIAGNOSIS — S61310A Laceration without foreign body of right index finger with damage to nail, initial encounter: Secondary | ICD-10-CM | POA: Diagnosis not present

## 2020-11-30 NOTE — Progress Notes (Deleted)
COMPLETE PHYSICAL   Assessment and Plan:   Evelyn Martinez was seen today for annual exam.  Diagnoses and all orders for this visit:  Encounter for annual physical exam Yearly  Elevated BP without diagnosis of hypertension -     EKG 12-Lead - NSR Monitor blood pressure at home; call if consistently over 130/80 Continue DASH diet.   Reminder to go to the ER if any CP, SOB, nausea, dizziness, severe HA, changes vision/speech, left arm numbness and tingling and jaw pain.   Lipid screening Discussed dietary and exercise modifications -     Lipid panel  Vitamin D deficiency Discussed dietary and exercise modifications -     VITAMIN D 25 Hydroxy (Vit-D Deficiency, Fractures)  Abnormal glucose Discussed dietary and exercise modifications -     Hemoglobin A1c  Screening for blood or protein in urine -     Urinalysis w microscopic -    Microalbumin /creatinine ratio urine  Medication management -Continued   Continue diet and meds as discussed. Further disposition pending results of labs. Discussed med's effects and SE's.  Patient agrees with plan of care and opportunity to ask questions/voice concerns. Over 30 minutes of chart review, interview, exam, counseling, and critical decision making was performed.   Future Appointments  Date Time Provider [place]  12/01/2020  2:00 PM Magda Bernheim, NP [facility] None  11/09/2021  2:00 PM Magda Bernheim, NP [facility] None    ----------------------------------------------------------------------------------------------------------------------  HPI 65 y.o. female  presents for 3 month follow up on elevated blood pressure w/o dx of htn, history of acquired hypothyroidism(no medications), history of pre-diabetes, weight and vitamin D deficiency.    Otherwise she has not health or medication concerns today.  On 08/30/19 she was seen by dermatology for mohs, basal cell carcinoma of the scalp.  BMI is There is no height or weight on  file to calculate BMI., she has been working on diet and exercise. Wt Readings from Last 3 Encounters:  05/20/20 112 lb (50.8 kg)  10/31/19 113 lb (51.3 kg)  08/22/19 112 lb 6.4 oz (51 kg)   Her blood pressure has been controlled at home, today their BP is    She does workout. She denies any cardiac symptoms, chest pains, palpitations, shortness of breath, dizziness or lower extremity edema.     She is not on cholesterol medication  Her cholesterol is at goal. The cholesterol last visit was:   Lab Results  Component Value Date   CHOL 187 10/31/2019   HDL 87 10/31/2019   LDLCALC 88 10/31/2019   TRIG 42 10/31/2019   CHOLHDL 2.1 10/31/2019    She has been working on diet and exercise for prediabetes, and denies nausea, paresthesia of the feet, polydipsia, polyuria, visual disturbances, vomiting and weight loss. Last A1C in the office was:  Lab Results  Component Value Date   HGBA1C 5.5 10/31/2019   Patient is on Vitamin D supplement.   Lab Results  Component Value Date   VD25OH 62 10/31/2019       Current Medications:  Current Outpatient Medications on File Prior to Visit  Medication Sig   albuterol (VENTOLIN HFA) 108 (90 Base) MCG/ACT inhaler Use 2 inhalations  15 minutes apart  every 4 hours  as Needed to Rescue Asthma   Ascorbic Acid (VITAMIN C PO) Take 2,000 mg by mouth.    Cyanocobalamin (VITAMIN B-12 SL) Place 1 tablet under the tongue daily.   ELDERBERRY PO Take by mouth. Takes 2 gummies daily  fluticasone (FLONASE) 50 MCG/ACT nasal spray Place into the nose.   levothyroxine (SYNTHROID) 25 MCG tablet Take one tablet by mouth 90min prior to first meal with full glass of water.   MAGNESIUM MALATE PO Take by mouth. Takes 1 tablet daily.   montelukast (SINGULAIR) 10 MG tablet TAKE 1 TABLET BY MOUTH DAILY FOR ALLERGIES   TURMERIC PO Take 1 tablet by mouth daily.   valACYclovir (VALTREX) 1000 MG tablet Take     1 tablet    Daily     for "Fever Blister" Prophylaxis   VITAMIN  D PO Take 5,000 Units by mouth. Takes 4 times a week   VITAMIN K PO Take by mouth. Takes 2 to 3 times a week   zinc gluconate 50 MG tablet Take 50 mg by mouth daily.   No current facility-administered medications on file prior to visit.    Allergies:  Allergies  Allergen Reactions   Epinephrine Palpitations    Increased HR    Bactrim [Sulfamethoxazole-Trimethoprim] Hives   Sulfamethoxazole Rash     Medical History:  Past Medical History:  Diagnosis Date   Cancer ([place])    basel cell   Stroke ([place]) 09/21/2017   TIA ( pt states under a lot of anxiety and stress);went home from [place]    Thyroid disease     Family history- Reviewed and unchanged   Social history- Reviewed and unchanged   Names of Other Physician/Practitioners you currently use: 1. [facility] here for primary care 2. Eye Exam: 2021 3. Dental Exam Every 6 months UTD 2021  Patient Care Team: Unk Pinto, MD as PCP - General (Internal Medicine)   Screening Tests: Immunization History  Administered Date(s) Administered   PFIZER Comirnaty(Gray Top)Covid-19 Tri-Sucrose Vaccine 05/11/2020   PFIZER(Purple Top)SARS-COV-2 Vaccination 06/04/2019, 07/02/2019   PPD Test 12/20/2018   Tdap 05/01/2009, 10/31/2019, 11/18/2020   Zoster, Live 12/02/2013     Vaccinations: TD or Tdap: 10/31/19  Influenza: *** Pneumococcal: Due *** Shingles: Zostavax/Shingrix: 11/2013 Covid: 06/04/19, 07/02/19, 05/11/20 Pfizer  Preventative Care: Last colonoscopy: 01/2018 DEXA: 09/07/16 - managed with womens health. Hep C screening (1945-1965): 10/31/19 Mammogram: 04/17/20 Pap: 08/22/18 WNL    Review of Systems  Constitutional:  Negative for chills and fever.  HENT:  Negative for congestion, hearing loss, sinus pain, sore throat and tinnitus.   Eyes:  Negative for blurred vision and double vision.  Respiratory:  Negative for cough, hemoptysis, sputum production, shortness of breath and  wheezing.   Cardiovascular:  Negative for chest pain, palpitations and leg swelling.  Gastrointestinal:  Negative for abdominal pain, constipation, diarrhea, heartburn, nausea and vomiting.  Genitourinary:  Negative for dysuria and urgency.  Musculoskeletal:  Negative for back pain, falls, joint pain, myalgias and neck pain.  Skin:  Negative for rash.  Neurological:  Negative for dizziness, tingling, tremors, weakness and headaches.  Endo/Heme/Allergies:  Does not bruise/bleed easily.  Psychiatric/Behavioral:  Negative for depression and suicidal ideas. The patient is not nervous/anxious and does not have insomnia.      Physical Exam: There were no vitals taken for this visit. Wt Readings from Last 3 Encounters:  05/20/20 112 lb (50.8 kg)  10/31/19 113 lb (51.3 kg)  08/22/19 112 lb 6.4 oz (51 kg)   General Appearance: Well nourished, in no apparent distress. Eyes: PERRLA, EOMs, conjunctiva no swelling or erythema Sinuses: No Frontal/maxillary tenderness ENT/Mouth: Ext aud canals clear, TMs without erythema, bulging. No erythema, swelling, or exudate on post pharynx.  Tonsils  not swollen or erythematous. Hearing normal.  Neck: Supple, thyroid normal.  Respiratory: Respiratory effort normal, BS equal bilaterally without rales, rhonchi, wheezing or stridor.  Cardio: RRR with no MRGs. Brisk peripheral pulses without edema.  Abdomen: Soft, + BS.  Non tender, no guarding, rebound, hernias, masses. Lymphatics: Non tender without lymphadenopathy.  Musculoskeletal: Full ROM, 5/5 strength, Normal gait Skin: Warm, dry without rashes, lesions, ecchymosis.  Neuro: Cranial nerves intact. No cerebellar symptoms.  Psych: Awake and oriented X 3, normal affect, Insight and Judgment appropriate.    Magda Bernheim, NP [facility] 11:45 AM

## 2020-12-01 ENCOUNTER — Encounter: Payer: PPO | Admitting: Nurse Practitioner

## 2020-12-03 DIAGNOSIS — S61310A Laceration without foreign body of right index finger with damage to nail, initial encounter: Secondary | ICD-10-CM | POA: Diagnosis not present

## 2020-12-15 NOTE — Progress Notes (Deleted)
COMPLETE PHYSICAL   Assessment and Plan:   Evelyn Martinez was seen today for annual exam.  Diagnoses and all orders for this visit:  Encounter for annual physical exam Yearly  Elevated BP without diagnosis of hypertension -     EKG 12-Lead - NSR Monitor blood pressure at home; call if consistently over 130/80 Continue DASH diet.   Reminder to go to the ER if any CP, SOB, nausea, dizziness, severe HA, changes vision/speech, left arm numbness and tingling and jaw pain.   Lipid screening Discussed dietary and exercise modifications -     Lipid panel  Vitamin D deficiency Discussed dietary and exercise modifications -     VITAMIN D 25 Hydroxy (Vit-D Deficiency, Fractures)  Abnormal glucose Discussed dietary and exercise modifications -     Hemoglobin A1c  Screening for blood or protein in urine -     Urinalysis w microscopic -    Microalbumin /creatinine ratio urine  Medication management -Continued   Continue diet and meds as discussed. Further disposition pending results of labs. Discussed med's effects and SE's.  Patient agrees with plan of care and opportunity to ask questions/voice concerns. Over 30 minutes of chart review, interview, exam, counseling, and critical decision making was performed.   Future Appointments  Date Time Provider [place]  12/16/2020 10:00 AM Magda Bernheim, NP [facility] None  11/09/2021  2:00 PM Magda Bernheim, NP [facility] None    ----------------------------------------------------------------------------------------------------------------------  HPI 65 y.o. female  presents for 3 month follow up on elevated blood pressure w/o dx of htn, history of acquired hypothyroidism(no medications), history of pre-diabetes, weight and vitamin D deficiency.    Otherwise she has not health or medication concerns today.  On 08/30/19 she was seen by dermatology for mohs, basal cell carcinoma of the scalp.  BMI is There is no height or weight on  file to calculate BMI., she has been working on diet and exercise. Wt Readings from Last 3 Encounters:  05/20/20 112 lb (50.8 kg)  10/31/19 113 lb (51.3 kg)  08/22/19 112 lb 6.4 oz (51 kg)   Her blood pressure has been controlled at home, today their BP is    She does workout. She denies any cardiac symptoms, chest pains, palpitations, shortness of breath, dizziness or lower extremity edema.     She is not on cholesterol medication  Her cholesterol is at goal. The cholesterol last visit was:   Lab Results  Component Value Date   CHOL 187 10/31/2019   HDL 87 10/31/2019   LDLCALC 88 10/31/2019   TRIG 42 10/31/2019   CHOLHDL 2.1 10/31/2019    She has been working on diet and exercise for prediabetes, and denies nausea, paresthesia of the feet, polydipsia, polyuria, visual disturbances, vomiting and weight loss. Last A1C in the office was:  Lab Results  Component Value Date   HGBA1C 5.5 10/31/2019   Patient is on Vitamin D supplement.   Lab Results  Component Value Date   VD25OH 62 10/31/2019       Current Medications:  Current Outpatient Medications on File Prior to Visit  Medication Sig   albuterol (VENTOLIN HFA) 108 (90 Base) MCG/ACT inhaler Use 2 inhalations  15 minutes apart  every 4 hours  as Needed to Rescue Asthma   Ascorbic Acid (VITAMIN C PO) Take 2,000 mg by mouth.    Cyanocobalamin (VITAMIN B-12 SL) Place 1 tablet under the tongue daily.   ELDERBERRY PO Take by mouth. Takes 2 gummies daily  fluticasone (FLONASE) 50 MCG/ACT nasal spray Place into the nose.   levothyroxine (SYNTHROID) 25 MCG tablet Take one tablet by mouth 90min prior to first meal with full glass of water.   MAGNESIUM MALATE PO Take by mouth. Takes 1 tablet daily.   montelukast (SINGULAIR) 10 MG tablet TAKE 1 TABLET BY MOUTH DAILY FOR ALLERGIES   TURMERIC PO Take 1 tablet by mouth daily.   valACYclovir (VALTREX) 1000 MG tablet Take     1 tablet    Daily     for "Fever Blister" Prophylaxis   VITAMIN  D PO Take 5,000 Units by mouth. Takes 4 times a week   VITAMIN K PO Take by mouth. Takes 2 to 3 times a week   zinc gluconate 50 MG tablet Take 50 mg by mouth daily.   No current facility-administered medications on file prior to visit.    Allergies:  Allergies  Allergen Reactions   Epinephrine Palpitations    Increased HR    Bactrim [Sulfamethoxazole-Trimethoprim] Hives   Sulfamethoxazole Rash     Medical History:  Past Medical History:  Diagnosis Date   Cancer ([place])    basel cell   Stroke ([place]) 09/21/2017   TIA ( pt states under a lot of anxiety and stress);went home from [place]    Thyroid disease     Family history- Reviewed and unchanged   Social history- Reviewed and unchanged   Names of Other Physician/Practitioners you currently use: 1. [facility] here for primary care 2. Eye Exam: 2021 3. Dental Exam Every 6 months UTD 2021  Patient Care Team: Unk Pinto, MD as PCP - General (Internal Medicine)   Screening Tests: Immunization History  Administered Date(s) Administered   PFIZER Comirnaty(Gray Top)Covid-19 Tri-Sucrose Vaccine 05/11/2020   PFIZER(Purple Top)SARS-COV-2 Vaccination 06/04/2019, 07/02/2019   PPD Test 12/20/2018   Tdap 05/01/2009, 10/31/2019, 11/18/2020   Zoster, Live 12/02/2013     Vaccinations: TD or Tdap: 10/31/19  Influenza: *** Pneumococcal: Due *** Shingles: Zostavax/Shingrix: 11/2013 Covid: 06/04/19, 07/02/19, 05/11/20 Pfizer  Preventative Care: Last colonoscopy: 01/2018 DEXA: 09/07/16 - managed with womens health. Hep C screening (1945-1965): 10/31/19 Mammogram: 04/17/20 Pap: 08/22/18 WNL    Review of Systems  Constitutional:  Negative for chills and fever.  HENT:  Negative for congestion, hearing loss, sinus pain, sore throat and tinnitus.   Eyes:  Negative for blurred vision and double vision.  Respiratory:  Negative for cough, hemoptysis, sputum production, shortness of breath and  wheezing.   Cardiovascular:  Negative for chest pain, palpitations and leg swelling.  Gastrointestinal:  Negative for abdominal pain, constipation, diarrhea, heartburn, nausea and vomiting.  Genitourinary:  Negative for dysuria and urgency.  Musculoskeletal:  Negative for back pain, falls, joint pain, myalgias and neck pain.  Skin:  Negative for rash.  Neurological:  Negative for dizziness, tingling, tremors, weakness and headaches.  Endo/Heme/Allergies:  Does not bruise/bleed easily.  Psychiatric/Behavioral:  Negative for depression and suicidal ideas. The patient is not nervous/anxious and does not have insomnia.      Physical Exam: There were no vitals taken for this visit. Wt Readings from Last 3 Encounters:  05/20/20 112 lb (50.8 kg)  10/31/19 113 lb (51.3 kg)  08/22/19 112 lb 6.4 oz (51 kg)   General Appearance: Well nourished, in no apparent distress. Eyes: PERRLA, EOMs, conjunctiva no swelling or erythema Sinuses: No Frontal/maxillary tenderness ENT/Mouth: Ext aud canals clear, TMs without erythema, bulging. No erythema, swelling, or exudate on post pharynx.  Tonsils  not swollen or erythematous. Hearing normal.  Neck: Supple, thyroid normal.  Respiratory: Respiratory effort normal, BS equal bilaterally without rales, rhonchi, wheezing or stridor.  Cardio: RRR with no MRGs. Brisk peripheral pulses without edema.  Abdomen: Soft, + BS.  Non tender, no guarding, rebound, hernias, masses. Lymphatics: Non tender without lymphadenopathy.  Musculoskeletal: Full ROM, 5/5 strength, Normal gait Skin: Warm, dry without rashes, lesions, ecchymosis.  Neuro: Cranial nerves intact. No cerebellar symptoms.  Psych: Awake and oriented X 3, normal affect, Insight and Judgment appropriate.    Magda Bernheim, NP [facility] 11:45 AM

## 2020-12-16 ENCOUNTER — Encounter: Payer: PPO | Admitting: Nurse Practitioner

## 2020-12-17 ENCOUNTER — Ambulatory Visit: Payer: PPO | Admitting: Nurse Practitioner

## 2020-12-25 DIAGNOSIS — L728 Other follicular cysts of the skin and subcutaneous tissue: Secondary | ICD-10-CM | POA: Diagnosis not present

## 2020-12-25 DIAGNOSIS — Z85828 Personal history of other malignant neoplasm of skin: Secondary | ICD-10-CM | POA: Diagnosis not present

## 2020-12-25 DIAGNOSIS — D225 Melanocytic nevi of trunk: Secondary | ICD-10-CM | POA: Diagnosis not present

## 2020-12-25 DIAGNOSIS — L570 Actinic keratosis: Secondary | ICD-10-CM | POA: Diagnosis not present

## 2020-12-25 DIAGNOSIS — L821 Other seborrheic keratosis: Secondary | ICD-10-CM | POA: Diagnosis not present

## 2020-12-25 DIAGNOSIS — L84 Corns and callosities: Secondary | ICD-10-CM | POA: Diagnosis not present

## 2020-12-25 DIAGNOSIS — L814 Other melanin hyperpigmentation: Secondary | ICD-10-CM | POA: Diagnosis not present

## 2020-12-25 DIAGNOSIS — Z08 Encounter for follow-up examination after completed treatment for malignant neoplasm: Secondary | ICD-10-CM | POA: Diagnosis not present

## 2020-12-29 NOTE — Progress Notes (Deleted)
COMPLETE PHYSICAL   Assessment and Plan:   Kathia was seen today for annual exam.  Diagnoses and all orders for this visit:  Encounter for annual physical exam Yearly  Elevated BP without diagnosis of hypertension -     EKG 12-Lead - NSR Monitor blood pressure at home; call if consistently over 130/80 Continue DASH diet.   Reminder to go to the ER if any CP, SOB, nausea, dizziness, severe HA, changes vision/speech, left arm numbness and tingling and jaw pain.   Lipid screening Discussed dietary and exercise modifications -     Lipid panel  Vitamin D deficiency Discussed dietary and exercise modifications -     VITAMIN D 25 Hydroxy (Vit-D Deficiency, Fractures)  Abnormal glucose Discussed dietary and exercise modifications -     Hemoglobin A1c  Screening for blood or protein in urine -     Urinalysis w microscopic -    Microalbumin /creatinine ratio urine  Medication management -Continued   Continue diet and meds as discussed. Further disposition pending results of labs. Discussed med's effects and SE's.  Patient agrees with plan of care and opportunity to ask questions/voice concerns. Over 30 minutes of chart review, interview, exam, counseling, and critical decision making was performed.   Future Appointments  Date Time Provider [place]  12/30/2020  2:00 PM Magda Bernheim, NP [facility] None    ----------------------------------------------------------------------------------------------------------------------  HPI 65 y.o. female  presents for 3 month follow up on elevated blood pressure w/o dx of htn, history of acquired hypothyroidism(no medications), history of pre-diabetes, weight and vitamin D deficiency.    Otherwise she has not health or medication concerns today.  On 08/30/19 she was seen by dermatology for mohs, basal cell carcinoma of the scalp.  BMI is There is no height or weight on file to calculate BMI., she has been working on diet  and exercise. Wt Readings from Last 3 Encounters:  05/20/20 112 lb (50.8 kg)  10/31/19 113 lb (51.3 kg)  08/22/19 112 lb 6.4 oz (51 kg)   Her blood pressure has been controlled at home, today their BP is    She does workout. She denies any cardiac symptoms, chest pains, palpitations, shortness of breath, dizziness or lower extremity edema.     She is not on cholesterol medication  Her cholesterol is at goal. The cholesterol last visit was:   Lab Results  Component Value Date   CHOL 187 10/31/2019   HDL 87 10/31/2019   LDLCALC 88 10/31/2019   TRIG 42 10/31/2019   CHOLHDL 2.1 10/31/2019    She has been working on diet and exercise for prediabetes, and denies nausea, paresthesia of the feet, polydipsia, polyuria, visual disturbances, vomiting and weight loss. Last A1C in the office was:  Lab Results  Component Value Date   HGBA1C 5.5 10/31/2019   Patient is on Vitamin D supplement.   Lab Results  Component Value Date   VD25OH 62 10/31/2019       Current Medications:  Current Outpatient Medications on File Prior to Visit  Medication Sig   albuterol (VENTOLIN HFA) 108 (90 Base) MCG/ACT inhaler Use 2 inhalations  15 minutes apart  every 4 hours  as Needed to Rescue Asthma   Ascorbic Acid (VITAMIN C PO) Take 2,000 mg by mouth.    Cyanocobalamin (VITAMIN B-12 SL) Place 1 tablet under the tongue daily.   ELDERBERRY PO Take by mouth. Takes 2 gummies daily   fluticasone (FLONASE) 50 MCG/ACT nasal spray Place into the  nose.   levothyroxine (SYNTHROID) 25 MCG tablet Take one tablet by mouth 5min prior to first meal with full glass of water.   MAGNESIUM MALATE PO Take by mouth. Takes 1 tablet daily.   montelukast (SINGULAIR) 10 MG tablet TAKE 1 TABLET BY MOUTH DAILY FOR ALLERGIES   TURMERIC PO Take 1 tablet by mouth daily.   valACYclovir (VALTREX) 1000 MG tablet Take     1 tablet    Daily     for "Fever Blister" Prophylaxis   VITAMIN D PO Take 5,000 Units by mouth. Takes 4 times a week    VITAMIN K PO Take by mouth. Takes 2 to 3 times a week   zinc gluconate 50 MG tablet Take 50 mg by mouth daily.   No current facility-administered medications on file prior to visit.    Allergies:  Allergies  Allergen Reactions   Epinephrine Palpitations    Increased HR    Bactrim [Sulfamethoxazole-Trimethoprim] Hives   Sulfamethoxazole Rash     Medical History:  Past Medical History:  Diagnosis Date   Cancer ([place])    basel cell   Stroke ([place]) 09/21/2017   TIA ( pt states under a lot of anxiety and stress);went home from [place]    Thyroid disease     Family history- Reviewed and unchanged   Social history- Reviewed and unchanged   Names of Other Physician/Practitioners you currently use: 1. [facility] here for primary care 2. Eye Exam: 2021 3. Dental Exam Every 6 months UTD 2021  Patient Care Team: Unk Pinto, MD as PCP - General (Internal Medicine)   Screening Tests: Immunization History  Administered Date(s) Administered   PFIZER Comirnaty(Gray Top)Covid-19 Tri-Sucrose Vaccine 05/11/2020   PFIZER(Purple Top)SARS-COV-2 Vaccination 06/04/2019, 07/02/2019   PPD Test 12/20/2018   Tdap 05/01/2009, 10/31/2019, 11/18/2020   Zoster, Live 12/02/2013     Vaccinations: TD or Tdap: 10/31/19  Influenza: *** Pneumococcal: Due *** Shingles: Zostavax/Shingrix: 11/2013 Covid: 06/04/19, 07/02/19, 05/11/20 Pfizer  Preventative Care: Last colonoscopy: 01/2018 DEXA: 09/07/16 - managed with womens health. Hep C screening (1945-1965): 10/31/19 Mammogram: 04/17/20 Pap: 08/22/18 WNL    Review of Systems  Constitutional:  Negative for chills and fever.  HENT:  Negative for congestion, hearing loss, sinus pain, sore throat and tinnitus.   Eyes:  Negative for blurred vision and double vision.  Respiratory:  Negative for cough, hemoptysis, sputum production, shortness of breath and wheezing.   Cardiovascular:  Negative for chest pain,  palpitations and leg swelling.  Gastrointestinal:  Negative for abdominal pain, constipation, diarrhea, heartburn, nausea and vomiting.  Genitourinary:  Negative for dysuria and urgency.  Musculoskeletal:  Negative for back pain, falls, joint pain, myalgias and neck pain.  Skin:  Negative for rash.  Neurological:  Negative for dizziness, tingling, tremors, weakness and headaches.  Endo/Heme/Allergies:  Does not bruise/bleed easily.  Psychiatric/Behavioral:  Negative for depression and suicidal ideas. The patient is not nervous/anxious and does not have insomnia.      Physical Exam: There were no vitals taken for this visit. Wt Readings from Last 3 Encounters:  05/20/20 112 lb (50.8 kg)  10/31/19 113 lb (51.3 kg)  08/22/19 112 lb 6.4 oz (51 kg)   General Appearance: Well nourished, in no apparent distress. Eyes: PERRLA, EOMs, conjunctiva no swelling or erythema Sinuses: No Frontal/maxillary tenderness ENT/Mouth: Ext aud canals clear, TMs without erythema, bulging. No erythema, swelling, or exudate on post pharynx.  Tonsils not swollen or erythematous. Hearing normal.  Neck: Supple,  thyroid normal.  Respiratory: Respiratory effort normal, BS equal bilaterally without rales, rhonchi, wheezing or stridor.  Cardio: RRR with no MRGs. Brisk peripheral pulses without edema.  Abdomen: Soft, + BS.  Non tender, no guarding, rebound, hernias, masses. Lymphatics: Non tender without lymphadenopathy.  Musculoskeletal: Full ROM, 5/5 strength, Normal gait Skin: Warm, dry without rashes, lesions, ecchymosis.  Neuro: Cranial nerves intact. No cerebellar symptoms.  Psych: Awake and oriented X 3, normal affect, Insight and Judgment appropriate.    Magda Bernheim, NP [facility] 11:45 AM

## 2020-12-30 ENCOUNTER — Encounter: Payer: PPO | Admitting: Nurse Practitioner

## 2021-02-01 NOTE — Progress Notes (Signed)
COMPLETE PHYSICAL   Assessment and Plan:   Evelyn Martinez was seen today for annual exam.  Diagnoses and all orders for this visit:  Encounter for general adult medical examination with abnormal findings Due Yearly  Elevated BP without diagnosis of hypertension -     CBC with Differential/Platelet -     COMPLETE METABOLIC PANEL WITH GFR - Controlled without medication, DASH diet, exercise and monitor at home. Call if greater than 130/80.   Abnormal glucose -     Hemoglobin A1c Continue diet and exercise  Acquired hypothyroidism -     TSH Please take your thyroid medication greater than 30 min before breakfast, separated by at least 4 hours  from antacids, calcium, iron, and multivitamins.   Sinus bradycardia Pt works out 5 days a week on [employer] , walking and weights. Monitor. If develops dizziness, blurred vision, feels faint call the office  Vitamin D deficiency -     VITAMIN D 25 Hydroxy (Vit-D Deficiency, Fractures) Continue Vit D supplementation to maintain level between 60-100 for therapeutic benefits  Medication management -     Magnesium  Lipid screening -     Lipid panel  Screening for blood or protein in urine -     Urinalysis, Routine w reflex microscopic -     Microalbumin / creatinine urine ratio  Screening for ischemic heart disease -     EKG 12-Lead       Continue diet and meds as discussed. Further disposition pending results of labs. Discussed med's effects and SE's.  Patient agrees with plan of care and opportunity to ask questions/voice concerns. Over 30 minutes of chart review, interview, exam, counseling, and critical decision making was performed.   Future Appointments  Date Time Provider [place]  02/07/2022  9:00 AM Magda Bernheim, NP [facility] None    ----------------------------------------------------------------------------------------------------------------------  HPI 65 y.o. female  presents for 3 month follow up on  elevated blood pressure w/o dx of htn, history of acquired hypothyroidism(no medications), history of pre-diabetes, weight and vitamin D deficiency.   She had already had flu vaccine  Had used immersion blender and had repair of large laceration on index finger right finger. Area has healed well.   BMI is Body mass index is 22.49 kg/m., she has been working on diet and exercise. [employer] works out daily 5 days a week, weights and walking Wt Readings from Last 3 Encounters:  02/03/21 121 lb (54.9 kg)  05/20/20 112 lb (50.8 kg)  10/31/19 113 lb (51.3 kg)   Her blood pressure has been controlled at home, today their BP is BP: 130/78. Normally Bp is low BP Readings from Last 3 Encounters:  02/03/21 130/78  11/18/20 (!) 142/85  05/20/20 110/76     She does workout. She denies any cardiac symptoms, chest pains, palpitations, shortness of breath, dizziness or lower extremity edema.     She is not on cholesterol medication  Her cholesterol is at goal. The cholesterol last visit was:   Lab Results  Component Value Date   CHOL 187 10/31/2019   HDL 87 10/31/2019   LDLCALC 88 10/31/2019   TRIG 42 10/31/2019   CHOLHDL 2.1 10/31/2019    She has been working on diet and exercise for prediabetes, and denies nausea, paresthesia of the feet, polydipsia, polyuria, visual disturbances, vomiting and weight loss. Last A1C in the office was:  Lab Results  Component Value Date   HGBA1C 5.5 10/31/2019   Patient is on Vitamin D supplement.  Lab Results  Component Value Date   VD25OH 62 10/31/2019       Current Medications:  Current Outpatient Medications on File Prior to Visit  Medication Sig   albuterol (VENTOLIN HFA) 108 (90 Base) MCG/ACT inhaler Use 2 inhalations  15 minutes apart  every 4 hours  as Needed to Rescue Asthma   Ascorbic Acid (VITAMIN C PO) Take 2,000 mg by mouth.    Cyanocobalamin (VITAMIN B-12 SL) Place 1 tablet under the tongue daily.   fluticasone (FLONASE) 50 MCG/ACT nasal  spray Place into the nose.   levothyroxine (SYNTHROID) 25 MCG tablet Take one tablet by mouth 82min prior to first meal with full glass of water.   montelukast (SINGULAIR) 10 MG tablet TAKE 1 TABLET BY MOUTH DAILY FOR ALLERGIES   valACYclovir (VALTREX) 1000 MG tablet Take     1 tablet    Daily     for "Fever Blister" Prophylaxis   VITAMIN D PO Take 5,000 Units by mouth. Takes 4 times a week   zinc gluconate 50 MG tablet Take 50 mg by mouth daily.   ELDERBERRY PO Take by mouth. Takes 2 gummies daily (Patient not taking: Reported on 02/03/2021)   MAGNESIUM MALATE PO Take by mouth. Takes 1 tablet daily. (Patient not taking: Reported on 02/03/2021)   TURMERIC PO Take 1 tablet by mouth daily. (Patient not taking: Reported on 02/03/2021)   VITAMIN K PO Take by mouth. Takes 2 to 3 times a week (Patient not taking: Reported on 02/03/2021)   No current facility-administered medications on file prior to visit.    Allergies:  Allergies  Allergen Reactions   Epinephrine Palpitations    Increased HR    Bactrim [Sulfamethoxazole-Trimethoprim] Hives   Sulfamethoxazole Rash     Medical History:  Past Medical History:  Diagnosis Date   Cancer ([place])    basel cell   Stroke ([place]) 09/21/2017   TIA ( pt states under a lot of anxiety and stress);went home from [place]    Thyroid disease     Family history- Reviewed and unchanged   Social history- Reviewed and unchanged   Names of Other Physician/Practitioners you currently use: 1. [facility] here for primary care 2. Eye Exam: 2021 3. Dental Exam Every 6 months UTD 2021  Patient Care Team: Unk Pinto, MD as PCP - General (Internal Medicine)   Screening Tests: Immunization History  Administered Date(s) Administered   Influenza, High Dose Seasonal PF 01/20/2021   PFIZER Comirnaty(Gray Top)Covid-19 Tri-Sucrose Vaccine 05/11/2020   PFIZER(Purple Top)SARS-COV-2 Vaccination 06/04/2019, 07/02/2019    PPD Test 12/20/2018   Tdap 05/01/2009, 10/31/2019, 11/18/2020   Zoster, Live 12/02/2013     Vaccinations: TD or Tdap: 05/01/2009  Influenza: Due for 2021 Pneumococcal: at age 80, discussed with patient Shingles: Zostavax/Shingrix: 11/2013   Preventative Care: Last colonoscopy: 01/2018 Dr. Loletha Carrow due 2029 DEXA: 09/07/16 - managed with wom[facility]. Hep C screening ([id number]):  Mammogram: 04/17/20 Pap: 08/2020 WNL   Review of Systems:  Review of Systems  Constitutional:  Negative for chills, diaphoresis, fever, malaise/fatigue and weight loss.  HENT:  Negative for congestion, ear discharge, ear pain, hearing loss, nosebleeds, sinus pain, sore throat and tinnitus.   Eyes:  Negative for blurred vision, double vision, photophobia, pain, discharge and redness.  Respiratory:  Negative for cough, hemoptysis, sputum production, shortness of breath, wheezing and stridor.   Cardiovascular:  Negative for chest pain, palpitations, orthopnea, claudication, leg swelling and PND.  Gastrointestinal:  Negative for  abdominal pain, blood in stool, constipation, diarrhea, heartburn, melena, nausea and vomiting.  Genitourinary:  Negative for dysuria, flank pain, frequency, hematuria and urgency.  Musculoskeletal:  Negative for back pain, falls, joint pain, myalgias and neck pain.  Skin:  Negative for itching and rash.  Neurological:  Negative for dizziness, tingling, tremors, sensory change, speech change, focal weakness, seizures, loss of consciousness, weakness and headaches.  Endo/Heme/Allergies:  Negative for environmental allergies and polydipsia. Does not bruise/bleed easily.  Psychiatric/Behavioral:  Negative for depression, hallucinations, memory loss, substance abuse and suicidal ideas. The patient is not nervous/anxious and does not have insomnia.      Physical Exam: BP 130/78   Pulse (!) 54   Temp (!) 97.3 F (36.3 C)   Ht 5' 1.5" (1.562 m)   Wt 121 lb (54.9 kg)   SpO2 97%   BMI 22.49  kg/m  Wt Readings from Last 3 Encounters:  02/03/21 121 lb (54.9 kg)  05/20/20 112 lb (50.8 kg)  10/31/19 113 lb (51.3 kg)   General Appearance: Well nourished, in no apparent distress. Eyes: PERRLA, EOMs, conjunctiva no swelling or erythema Sinuses: No Frontal/maxillary tenderness ENT/Mouth: Ext aud canals clear, TMs without erythema, bulging. No erythema, swelling, or exudate on post pharynx.  Tonsils not swollen or erythematous. Hearing normal.  Neck: Supple, thyroid normal.  Respiratory: Respiratory effort normal, BS equal bilaterally without rales, rhonchi, wheezing or stridor.  Cardio: RRR with no MRGs. Brisk peripheral pulses without edema.  Abdomen: Soft, + BS.  Non tender, no guarding, rebound, hernias, masses. Lymphatics: Non tender without lymphadenopathy.  Musculoskeletal: Full ROM, 5/5 strength, Normal gait Skin: Warm, dry without rashes, lesions, ecchymosis.  Neuro: Cranial nerves intact. No cerebellar symptoms.  Psych: Awake and oriented X 3, normal affect, Insight and Judgment appropriate.  EKG: Sinus bradycardia, no ST changes  Oluwatimileyin Vivier Kathyrn Drown, NP [facility] 11:45 AM

## 2021-02-03 ENCOUNTER — Other Ambulatory Visit: Payer: Self-pay

## 2021-02-03 ENCOUNTER — Encounter: Payer: Self-pay | Admitting: Nurse Practitioner

## 2021-02-03 ENCOUNTER — Ambulatory Visit (INDEPENDENT_AMBULATORY_CARE_PROVIDER_SITE_OTHER): Payer: PPO | Admitting: Nurse Practitioner

## 2021-02-03 VITALS — BP 130/78 | HR 54 | Temp 97.3°F | Ht 61.5 in | Wt 121.0 lb

## 2021-02-03 DIAGNOSIS — Z79899 Other long term (current) drug therapy: Secondary | ICD-10-CM

## 2021-02-03 DIAGNOSIS — E559 Vitamin D deficiency, unspecified: Secondary | ICD-10-CM | POA: Diagnosis not present

## 2021-02-03 DIAGNOSIS — R030 Elevated blood-pressure reading, without diagnosis of hypertension: Secondary | ICD-10-CM

## 2021-02-03 DIAGNOSIS — E039 Hypothyroidism, unspecified: Secondary | ICD-10-CM | POA: Diagnosis not present

## 2021-02-03 DIAGNOSIS — R001 Bradycardia, unspecified: Secondary | ICD-10-CM

## 2021-02-03 DIAGNOSIS — Z1389 Encounter for screening for other disorder: Secondary | ICD-10-CM

## 2021-02-03 DIAGNOSIS — Z13220 Encounter for screening for lipoid disorders: Secondary | ICD-10-CM | POA: Diagnosis not present

## 2021-02-03 DIAGNOSIS — Z0001 Encounter for general adult medical examination with abnormal findings: Secondary | ICD-10-CM

## 2021-02-03 DIAGNOSIS — Z136 Encounter for screening for cardiovascular disorders: Secondary | ICD-10-CM

## 2021-02-03 DIAGNOSIS — Z0000 Encounter for general adult medical examination without abnormal findings: Secondary | ICD-10-CM | POA: Diagnosis not present

## 2021-02-03 DIAGNOSIS — R7309 Other abnormal glucose: Secondary | ICD-10-CM

## 2021-02-03 NOTE — Patient Instructions (Signed)

## 2021-02-04 ENCOUNTER — Other Ambulatory Visit: Payer: Self-pay | Admitting: Nurse Practitioner

## 2021-02-04 ENCOUNTER — Encounter: Payer: Self-pay | Admitting: Nurse Practitioner

## 2021-02-04 DIAGNOSIS — R946 Abnormal results of thyroid function studies: Secondary | ICD-10-CM

## 2021-02-04 LAB — COMPLETE METABOLIC PANEL WITH GFR
AG Ratio: 1.7 (calc) (ref 1.0–2.5)
ALT: 18 U/L (ref 6–29)
AST: 32 U/L (ref 10–35)
Albumin: 4.4 g/dL (ref 3.6–5.1)
Alkaline phosphatase (APISO): 59 U/L (ref 37–153)
BUN: 19 mg/dL (ref 7–25)
CO2: 31 mmol/L (ref 20–32)
Calcium: 10 mg/dL (ref 8.6–10.4)
Chloride: 103 mmol/L (ref 98–110)
Creat: 0.78 mg/dL (ref 0.50–1.05)
Globulin: 2.6 g/dL (calc) (ref 1.9–3.7)
Glucose, Bld: 89 mg/dL (ref 65–99)
Potassium: 4.8 mmol/L (ref 3.5–5.3)
Sodium: 142 mmol/L (ref 135–146)
Total Bilirubin: 0.5 mg/dL (ref 0.2–1.2)
Total Protein: 7 g/dL (ref 6.1–8.1)
eGFR: 84 mL/min/{1.73_m2} (ref >=60)

## 2021-02-04 LAB — CBC WITH DIFFERENTIAL/PLATELET
Absolute Monocytes: 451 cells/uL (ref 200–950)
Basophils Absolute: 41 cells/uL (ref 0–200)
Basophils Relative: 1 %
Eosinophils Absolute: 152 cells/uL (ref 15–500)
Eosinophils Relative: 3.7 %
HCT: 41.1 % (ref 35.0–45.0)
Hemoglobin: 14 g/dL (ref 11.7–15.5)
Lymphs Abs: 1697 cells/uL (ref 850–3900)
MCH: 32.3 pg (ref 27.0–33.0)
MCHC: 34.1 g/dL (ref 32.0–36.0)
MCV: 94.7 fL (ref 80.0–100.0)
MPV: 10.5 fL (ref 7.5–12.5)
Monocytes Relative: 11 %
Neutro Abs: 1759 cells/uL (ref 1500–7800)
Neutrophils Relative %: 42.9 %
Platelets: 279 10*3/uL (ref 140–400)
RBC: 4.34 10*6/uL (ref 3.80–5.10)
RDW: 12.6 % (ref 11.0–15.0)
Total Lymphocyte: 41.4 %
WBC: 4.1 10*3/uL (ref 3.8–10.8)

## 2021-02-04 LAB — URINALYSIS, ROUTINE W REFLEX MICROSCOPIC
Bilirubin Urine: NEGATIVE
Glucose, UA: NEGATIVE
Hgb urine dipstick: NEGATIVE
Ketones, ur: NEGATIVE
Leukocytes,Ua: NEGATIVE
Nitrite: NEGATIVE
Protein, ur: NEGATIVE
Specific Gravity, Urine: 1.006 (ref 1.001–1.035)
pH: 7 (ref 5.0–8.0)

## 2021-02-04 LAB — LIPID PANEL
Cholesterol: 213 mg/dL — ABNORMAL HIGH (ref <200)
HDL: 123 mg/dL (ref >=50)
LDL Cholesterol (Calc): 77 mg/dL (calc)
Non-HDL Cholesterol (Calc): 90 mg/dL (calc) (ref <130)
Total CHOL/HDL Ratio: 1.7 (calc) (ref <5.0)
Triglycerides: 46 mg/dL (ref <150)

## 2021-02-04 LAB — MICROALBUMIN / CREATININE URINE RATIO
Creatinine, Urine: 25 mg/dL (ref 20–275)
Microalb, Ur: <0.2 mg/dL

## 2021-02-04 LAB — HEMOGLOBIN A1C
Hgb A1c MFr Bld: 5.4 % of total Hgb (ref <5.7)
Mean Plasma Glucose: 108 mg/dL
eAG (mmol/L): 6 mmol/L

## 2021-02-04 LAB — TSH: TSH: 4.9 mIU/L — ABNORMAL HIGH (ref 0.40–4.50)

## 2021-02-04 LAB — VITAMIN D 25 HYDROXY (VIT D DEFICIENCY, FRACTURES): Vit D, 25-Hydroxy: 70 ng/mL (ref 30–100)

## 2021-02-04 LAB — MAGNESIUM: Magnesium: 2 mg/dL (ref 1.5–2.5)

## 2021-03-10 DIAGNOSIS — M25531 Pain in right wrist: Secondary | ICD-10-CM | POA: Diagnosis not present

## 2021-03-31 NOTE — Progress Notes (Signed)
Assessment and Plan:  Evelyn Martinez was seen today for cough.  Diagnoses and all orders for this visit:  Chest tightness Persistent dry cough Fatigue, unspecified type ? Walking pneumonia in asthmatic, however consider cardiac etiology, PE Check STAT labs today including D dimer, if elevated CTA STAT Low risk personal hx for cardiac etiology; EKG today NSR without ST changes, NSCPT, no evidence of strain Discussed cannot check STAT troponin to fully r/o non-stemi Go to the ER if any worsening chest pain, shortness of breath, nausea, dizziness, severe HA, changes vision/speech If all negative plan short steroid taper and cough med for possible post-viral cough syndrome  Close follow up if persistent or new sx -     CBC with Differential/Platelet -     BASIC METABOLIC PANEL WITH GFR -     D-dimer, quantitative -     DG Chest 2 View; Future -     EKG 12-Lead  Further disposition pending results of labs. Discussed med's effects and SE's.   Over 30 minutes of exam, counseling, chart review, and critical decision making was performed.   Future Appointments  Date Time Provider [place]  05/12/2021  1:45 PM [facility] NURSE [facility] None  02/07/2022  9:00 AM Magda Bernheim, NP [facility] None    ------------------------------------------------------------------------------------------------------------------   HPI BP 136/76    Pulse 76    Temp 97.9 F (36.6 C)    Resp 18    Ht 5' 1.5" (1.562 m)    Wt 117 lb (53.1 kg)    SpO2 94%    BMI 21.75 kg/m  66 y.o.female with hx of asthma and allergies presents for evaluation of persistent cough and chest tightness of 3 weeks.   She reports new years day woke up with dry cough, tightness in her chest (does ? Slightly improve with inhaler), intermittent, cannot clearly correlate with exertion, persistent fatigue, some new left back aching today.  She reports has had persistent chills, had warm/flushed feeling the first week a few days but no  elevated temp measurement. Cough from chest but non-productive. Mild nasal congestion but stable from baseline, no sinus tenderness/pain/ear pressure.   She reports allergies have been well controlled, she has been on singulair, will add H2i as needed intermittently.   She denies racing heart, dizziness, arm pain, neck pain, jaw pain.  No hx of clots, recent prolonged travel or estrogen therapy.   Generally very active, hasn't felt up to typical exercise.   Denies GERD hx or burning/reflux sx, not worse with food intake.    Past Medical History:  Diagnosis Date   Cancer ([place])    basel cell   Stroke ([place]) 09/21/2017   TIA ( pt states under a lot of anxiety and stress);went home from [place]    Thyroid disease      Allergies  Allergen Reactions   Epinephrine Palpitations    Increased HR    Bactrim [Sulfamethoxazole-Trimethoprim] Hives   Sulfamethoxazole Rash    Current Outpatient Medications on File Prior to Visit  Medication Sig   albuterol (VENTOLIN HFA) 108 (90 Base) MCG/ACT inhaler Use 2 inhalations  15 minutes apart  every 4 hours  as Needed to Rescue Asthma   Ascorbic Acid (VITAMIN C PO) Take 2,000 mg by mouth.    Cyanocobalamin (VITAMIN B-12 SL) Place 1 tablet under the tongue daily.   fluticasone (FLONASE) 50 MCG/ACT nasal spray Place into the nose.   levothyroxine (SYNTHROID) 25 MCG tablet Take one tablet by mouth 34min prior to  first meal with full glass of water.   montelukast (SINGULAIR) 10 MG tablet TAKE 1 TABLET BY MOUTH DAILY FOR ALLERGIES   valACYclovir (VALTREX) 1000 MG tablet Take     1 tablet    Daily     for "Fever Blister" Prophylaxis   VITAMIN D PO Take 5,000 Units by mouth. Takes 4 times a week   zinc gluconate 50 MG tablet Take 50 mg by mouth daily.   ELDERBERRY PO Take by mouth. Takes 2 gummies daily (Patient not taking: Reported on 02/03/2021)   MAGNESIUM MALATE PO Take by mouth. Takes 1 tablet daily. (Patient not taking: Reported on 02/03/2021)    TURMERIC PO Take 1 tablet by mouth daily. (Patient not taking: Reported on 02/03/2021)   VITAMIN K PO Take by mouth. Takes 2 to 3 times a week (Patient not taking: Reported on 02/03/2021)   No current facility-administered medications on file prior to visit.   Allergies:  Allergies  Allergen Reactions   Epinephrine Palpitations    Increased HR    Bactrim [Sulfamethoxazole-Trimethoprim] Hives   Sulfamethoxazole Rash   Surgical History:  She  has a past surgical history that includes Colonoscopy (10 years ago ); Carpal tunnel release (Bilateral); Cesarean section (1983); Breast enhancement surgery; and Neurectomy foot (2017). Family History:  Herfamily history includes Diabetes in her brother, father, sister, and sister; Uterine cancer in her mother. Social History:   reports that she has never smoked. She has never used smokeless tobacco. She reports current alcohol use of about 14.0 standard drinks per week. She reports that she does not currently use drugs.   ROS: all negative except above.   Physical Exam:  BP 136/76    Pulse 76    Temp 97.9 F (36.6 C)    Resp 18    Ht 5' 1.5" (1.562 m)    Wt 117 lb (53.1 kg)    SpO2 94%    BMI 21.75 kg/m   General Appearance: Well nourished, well dressed caucasian elder female, in no apparent distress. Eyes: PERRLA, EOMs, conjunctiva no swelling or erythema Sinuses: No Frontal/maxillary tenderness ENT/Mouth: Ext aud canals clear, TMs without erythema, bulging. No erythema, swelling, or exudate on post pharynx.  Tonsils not swollen or erythematous. Hearing normal.  Neck: Supple, no bruits or carotid distention, trachea midline Respiratory: Respiratory effort normal, BS equal bilaterally without rales, rhonchi, wheezing or stridor.  Cardio: RRR with no MRGs. Brisk peripheral pulses without edema.  Abdomen: Soft, + BS.  Non tender, no guarding. Lymphatics: Non tender without lymphadenopathy.  Musculoskeletal: normal gait.  Skin: Warm, dry  without rashes, lesions, ecchymosis.  Neuro:  Normal muscle tone Psych: Awake and oriented X 3, normal affect, Insight and Judgment appropriate.     Evelyn Ribas, NP 10:35 AM [facility]

## 2021-04-01 ENCOUNTER — Ambulatory Visit
Admission: RE | Admit: 2021-04-01 | Discharge: 2021-04-01 | Disposition: A | Discharge status: Home / Self Care | Payer: PPO | Source: Ambulatory Visit | Attending: Adult Health | Admitting: Adult Health

## 2021-04-01 ENCOUNTER — Encounter: Payer: Self-pay | Admitting: Adult Health

## 2021-04-01 ENCOUNTER — Ambulatory Visit (INDEPENDENT_AMBULATORY_CARE_PROVIDER_SITE_OTHER): Payer: PPO | Admitting: Adult Health

## 2021-04-01 ENCOUNTER — Other Ambulatory Visit: Payer: Self-pay | Admitting: Adult Health

## 2021-04-01 ENCOUNTER — Other Ambulatory Visit: Payer: Self-pay

## 2021-04-01 VITALS — BP 136/76 | HR 76 | Temp 97.9°F | Resp 18 | Ht 61.5 in | Wt 117.0 lb

## 2021-04-01 DIAGNOSIS — R059 Cough, unspecified: Secondary | ICD-10-CM | POA: Diagnosis not present

## 2021-04-01 DIAGNOSIS — R053 Chronic cough: Secondary | ICD-10-CM

## 2021-04-01 DIAGNOSIS — R5383 Other fatigue: Secondary | ICD-10-CM | POA: Diagnosis not present

## 2021-04-01 DIAGNOSIS — R0789 Other chest pain: Secondary | ICD-10-CM

## 2021-04-01 LAB — CBC WITH DIFFERENTIAL/PLATELET
Absolute Monocytes: 758 cells/uL (ref 200–950)
Basophils Absolute: 48 cells/uL (ref 0–200)
Basophils Relative: 0.9 %
Eosinophils Absolute: 170 cells/uL (ref 15–500)
Eosinophils Relative: 3.2 %
HCT: 43.3 % (ref 35.0–45.0)
Hemoglobin: 14.4 g/dL (ref 11.7–15.5)
Lymphs Abs: 1065 cells/uL (ref 850–3900)
MCH: 31.5 pg (ref 27.0–33.0)
MCHC: 33.3 g/dL (ref 32.0–36.0)
MCV: 94.7 fL (ref 80.0–100.0)
MPV: 10.1 fL (ref 7.5–12.5)
Monocytes Relative: 14.3 %
Neutro Abs: 3260 cells/uL (ref 1500–7800)
Neutrophils Relative %: 61.5 %
Platelets: 244 10*3/uL (ref 140–400)
RBC: 4.57 10*6/uL (ref 3.80–5.10)
RDW: 13.1 % (ref 11.0–15.0)
Total Lymphocyte: 20.1 %
WBC: 5.3 10*3/uL (ref 3.8–10.8)

## 2021-04-01 LAB — BASIC METABOLIC PANEL WITH GFR
BUN: 19 mg/dL (ref 7–25)
CO2: 35 mmol/L — ABNORMAL HIGH (ref 20–32)
Calcium: 9.8 mg/dL (ref 8.6–10.4)
Chloride: 102 mmol/L (ref 98–110)
Creat: 0.83 mg/dL (ref 0.50–1.05)
Glucose, Bld: 93 mg/dL (ref 65–99)
Potassium: 4.9 mmol/L (ref 3.5–5.3)
Sodium: 140 mmol/L (ref 135–146)
eGFR: 78 mL/min/{1.73_m2} (ref >=60)

## 2021-04-01 LAB — D-DIMER, QUANTITATIVE: D-Dimer, Quant: 0.43 mcg/mL FEU (ref <0.50)

## 2021-04-01 MED ORDER — PREDNISONE 20 MG PO TABS: ORAL_TABLET | ORAL | 0 refills | Status: DC

## 2021-04-01 MED ORDER — PROMETHAZINE-DM 6.25-15 MG/5ML PO SYRP: 5.0000 mL | ORAL_SOLUTION | Freq: Four times a day (QID) | ORAL | 1 refills | Status: DC | PRN

## 2021-04-01 NOTE — Patient Instructions (Signed)
Due to the non-specific nature of your chest discomfort, we are checking you out for obvious heart abnormality, clot in your lung, walking pneumonia.   If at any point getting worse, dizziness/passing out, new or worsening sympomts I still recommend you get checked out at an ER as we cannot fully work up a heart at primary care.   You can walk in for your chest xray after this appointment at [address] imaging center ([address] towards [place], then take a R at [address] at the [employer]).   If all comes back negative we will try a cough medication and steroid taper. Please let me know if any new symptoms or not improving.   Nonspecific Chest Pain, Adult Chest pain is an uncomfortable, tight, or painful feeling in the chest. The pain can feel like a crushing, aching, or squeezing pressure. A person can feel a burning or tingling sensation. Chest pain can also be felt in your back, neck, jaw, shoulder, or arm. This pain can be worse when you move, sneeze, or take a deep breath. Chest pain can be caused by a condition that is life-threatening. This must be treated right away. It can also be caused by something that is not life-threatening. If you have chest pain, it can be hard to know the difference, so it is important to get help right away to make sure that you do not have a serious condition. Some life-threatening causes of chest pain include: Heart attack. A tear in the body's main blood vessel (aortic dissection). Inflammation around your heart (pericarditis). A problem in the lungs, such as a blood clot (pulmonary embolism) or a collapsed lung (pneumothorax). Some non life-threatening causes of chest pain include: Heartburn. Anxiety or stress. Damage to the bones, muscles, and cartilage that make up your chest wall. Pneumonia or bronchitis. Shingles infection (varicella-zoster virus). Your chest pain may come and go. It may also be constant. Your health care provider  will do tests and other studies to find the cause of your pain. Treatment will depend on the cause of your chest pain. Follow these instructions at home: Medicines Take over-the-counter and prescription medicines only as told by your health care provider. If you were prescribed an antibiotic medicine, take it as told by your health care provider. Do not stop taking the antibiotic even if you start to feel better. Activity Avoid any activities that cause chest pain. Do not lift anything that is heavier than 10 lb (4.5 kg), or the limit that you are told, until your health care provider says that it is safe. Rest as directed by your health care provider. Return to your normal activities only as told by your health care provider. Ask your health care provider what activities are safe for you. Lifestyle   Do not use any products that contain nicotine or tobacco, such as cigarettes, e-cigarettes, and chewing tobacco. If you need help quitting, ask your health care provider. Do not drink alcohol. Make healthy lifestyle changes as recommended. These may include: Getting regular exercise. Ask your health care provider to suggest some exercises that are safe for you. Eating a heart-healthy diet. This includes plenty of fresh fruits and vegetables, whole grains, low-fat (lean) protein, and low-fat dairy products. A dietitian can help you find healthy eating options. Maintaining a healthy weight. Managing any other health conditions you may have, such as high blood pressure (hypertension) or diabetes. Reducing stress, such as with yoga or relaxation techniques. General  instructions Pay attention to any changes in your symptoms. It is up to you to get the results of any tests that were done. Ask your health care provider, or the department that is doing the tests, when your results will be ready. Keep all follow-up visits as told by your health care provider. This is important. You may be asked to go for  further testing if your chest pain does not go away. Contact a health care provider if: Your chest pain does not go away. You feel depressed. You have a fever. You notice changes in your symptoms or develop new symptoms. Get help right away if: Your chest pain gets worse. You have a cough that gets worse, or you cough up blood. You have severe pain in your abdomen. You faint. You have sudden, unexplained chest discomfort. You have sudden, unexplained discomfort in your arms, back, neck, or jaw. You have shortness of breath at any time. You suddenly start to sweat, or your skin gets clammy. You feel nausea or you vomit. You suddenly feel lightheaded or dizzy. You have severe weakness, or unexplained weakness or fatigue. Your heart begins to beat quickly, or it feels like it is skipping beats. These symptoms may represent a serious problem that is an emergency. Do not wait to see if the symptoms will go away. Get medical help right away. Call your local emergency services (911 in the [place] Do not drive yourself to the hospital. Summary Chest pain can be caused by a condition that is serious and requires urgent treatment. It may also be caused by something that is not life-threatening. Your health care provider may do lab tests and other studies to find the cause of your pain. Follow your health care provider's instructions on taking medicines, making lifestyle changes, and getting emergency treatment if symptoms become worse. Keep all follow-up visits as told by your health care provider. This includes visits for any further testing if your chest pain does not go away. This information is not intended to replace advice given to you by your health care provider. Make sure you discuss any questions you have with your health care provider. Document Revised: 05/14/2020 Document Reviewed: 05/14/2020 Elsevier Patient Education  [place].

## 2021-04-13 ENCOUNTER — Encounter: Payer: Self-pay | Admitting: Adult Health

## 2021-04-15 ENCOUNTER — Ambulatory Visit (INDEPENDENT_AMBULATORY_CARE_PROVIDER_SITE_OTHER): Payer: PPO | Admitting: Adult Health

## 2021-04-15 ENCOUNTER — Other Ambulatory Visit: Payer: Self-pay

## 2021-04-15 ENCOUNTER — Encounter: Payer: Self-pay | Admitting: Adult Health

## 2021-04-15 VITALS — BP 110/66 | HR 79 | Temp 97.3°F | Wt 117.0 lb

## 2021-04-15 DIAGNOSIS — J4521 Mild intermittent asthma with (acute) exacerbation: Secondary | ICD-10-CM

## 2021-04-15 MED ORDER — PREDNISONE 20 MG PO TABS: ORAL_TABLET | ORAL | 0 refills | Status: AC

## 2021-04-15 MED ORDER — BREZTRI AEROSPHERE 160-9-4.8 MCG/ACT IN AERO: 1.0000 | INHALATION_SPRAY | Freq: Two times a day (BID) | RESPIRATORY_TRACT | 0 refills | Status: DC

## 2021-04-15 MED ORDER — IPRATROPIUM-ALBUTEROL 0.5-2.5 (3) MG/3ML IN SOLN: 3.0000 mL | Freq: Once | RESPIRATORY_TRACT | Status: AC

## 2021-04-15 NOTE — Progress Notes (Signed)
Assessment and Plan:  Evelyn Martinez was seen today for acute visit.  Diagnoses and all orders for this visit:  Mild intermittent asthma with exacerbation No indication for repeat CXR, improved with neb in office, start inhaler and steroid taper, continue treatments for at least 3 days after sx improve. Contact provider if below not resolving sx within 3-5 days or any new fever/chills, productivity, dyspnea, weakness or get checked out in ED.  Evelyn Martinez has done well without daily inhaler for years; restart home regimen, avoid asthma triggers, Get on H2i x 2 weeks then as needed during allergy season Consider restarting symbicort - Evelyn Martinez declined script today, will contact office for this if Evelyn Martinez finds needing albuterol greater than twice a week  -     predniSONE (DELTASONE) 20 MG tablet; 3 tablets daily with food for 3 days, 2 tabs daily for 3 days, 1 tab a day for 5 days. -     Budeson-Glycopyrrol-Formoterol (BREZTRI AEROSPHERE) 160-9-4.8 MCG/ACT AERO; Inhale 1-2 puffs into the lungs in the morning and at bedtime. -     ipratropium-albuterol (DUONEB) 0.5-2.5 (3) MG/3ML nebulizer solution 3 mL   Further disposition pending results of labs. Discussed med's effects and SE's.   Over 30 minutes of exam, counseling, chart review, and critical decision making was performed.   Future Appointments  Date Time Provider [place]  05/12/2021  1:45 PM [facility] NURSE [facility] None  02/07/2022  9:00 AM Magda Bernheim, NP [facility] None    ------------------------------------------------------------------------------------------------------------------   HPI BP 110/66    Pulse 79    Temp (!) 97.3 F (36.3 C)    Wt 117 lb (53.1 kg)    SpO2 99%    BMI 21.75 kg/m  66 y.o.female with allergic rhinitis and asthma presents for evaluation of persistent cough.   Evelyn Martinez was seen 04/01/2020 for URI sx with dyspnea, was having some chest pain, had negative CXR, d dimer, EKG. Evelyn Martinez was treated as URI with asthma flare with  steroid taper and rescue inhaler. Evelyn Martinez reports was much improved and essentially resolved for a few days but has been cleaning house and going through dusty photos, started coughing again, non-productive, also sinus congestion (improving with saline irrigations), albuterol does help but needing multiple times a day, also using promethazine-DM. Evelyn Martinez has not been on daily inhaler in years after Evelyn Martinez went to see integrative med and started on supplements; reports sx improved within 6 months and was essentially asymptomatic for several years. Evelyn Martinez recalls was on symbicort at one point.   Today Evelyn Martinez denies wheezing, chest tightness, dyspnea. Just frequent reactive cough, dry, from chest. Persistent rhinitis "I have allergies year round but mild" - not currently on H2i.   Past Medical History:  Diagnosis Date   Cancer ([place])    basel cell   Stroke ([place]) 09/21/2017   TIA ( pt states under a lot of anxiety and stress);went home from [place]    Thyroid disease      Allergies  Allergen Reactions   Epinephrine Palpitations    Increased HR    Bactrim [Sulfamethoxazole-Trimethoprim] Hives   Sulfamethoxazole Rash    Current Outpatient Medications on File Prior to Visit  Medication Sig   albuterol (VENTOLIN HFA) 108 (90 Base) MCG/ACT inhaler Use 2 inhalations  15 minutes apart  every 4 hours  as Needed to Rescue Asthma   Ascorbic Acid (VITAMIN C PO) Take 2,000 mg by mouth.    Cyanocobalamin (VITAMIN B-12 SL) Place 1 tablet under the tongue daily.  ELDERBERRY PO Take by mouth. Takes 2 gummies daily   fluticasone (FLONASE) 50 MCG/ACT nasal spray Place into the nose.   levothyroxine (SYNTHROID) 25 MCG tablet Take one tablet by mouth 93min prior to first meal with full glass of water.   MAGNESIUM MALATE PO Take by mouth. Takes 1 tablet daily.   montelukast (SINGULAIR) 10 MG tablet TAKE 1 TABLET BY MOUTH DAILY FOR ALLERGIES   promethazine-dextromethorphan (PROMETHAZINE-DM) 6.25-15 MG/5ML syrup Take 5 mLs by  mouth 4 (four) times daily as needed for cough.   TURMERIC PO Take 1 tablet by mouth daily.   valACYclovir (VALTREX) 1000 MG tablet Take     1 tablet    Daily     for "Fever Blister" Prophylaxis   VITAMIN D PO Take 5,000 Units by mouth. Takes 4 times a week   VITAMIN K PO Take by mouth. Takes 2 to 3 times a week   zinc gluconate 50 MG tablet Take 50 mg by mouth daily.   No current facility-administered medications on file prior to visit.   Allergies:  Allergies  Allergen Reactions   Epinephrine Palpitations    Increased HR    Bactrim [Sulfamethoxazole-Trimethoprim] Hives   Sulfamethoxazole Rash   Surgical History:  Evelyn Martinez  has a past surgical history that includes Colonoscopy (10 years ago ); Carpal tunnel release (Bilateral); Cesarean section (1983); Breast enhancement surgery; and Neurectomy foot (2017). Family History:  Herfamily history includes Diabetes in her brother, father, sister, and sister; Uterine cancer in her mother. Social History:   reports that Evelyn Martinez has never smoked. Evelyn Martinez has never used smokeless tobacco. Evelyn Martinez reports current alcohol use of about 14.0 standard drinks per week. Evelyn Martinez reports that Evelyn Martinez does not currently use drugs.   ROS: all negative except above.   Physical Exam:  BP 110/66    Pulse 79    Temp (!) 97.3 F (36.3 C)    Wt 117 lb (53.1 kg)    SpO2 99%    BMI 21.75 kg/m   General Appearance: Well nourished, in no apparent distress. Eyes: PERRLA, conjunctiva no swelling or erythema Sinuses: No Frontal/maxillary tenderness ENT/Mouth: Ext aud canals clear, TMs without erythema, bulging. No erythema, swelling, or exudate on post pharynx.  Tonsils not swollen or erythematous. Hearing normal. Frequent dry coughing fits.  Neck: Supple Respiratory: Respiratory effort normal, BS equal bilaterally with scattered coarse bronchial sounds upper, no rales, rhonchi, wheezing or stridor.  Cardio: RRR with no MRGs. Brisk peripheral pulses without edema.  Abdomen: Soft, +  BS.  Non tender Lymphatics: Non tender without lymphadenopathy.  Musculoskeletal: normal gait.  Skin: Warm, dry without rashes, lesions, ecchymosis.  Neuro: Normal muscle tone Psych: Awake and oriented X 3, normal affect, Insight and Judgment appropriate.     [name], NP 9:30 AM [facility]

## 2021-04-20 DIAGNOSIS — N907 Vulvar cyst: Secondary | ICD-10-CM | POA: Diagnosis not present

## 2021-04-21 ENCOUNTER — Encounter: Payer: Self-pay | Admitting: Adult Health

## 2021-04-26 DIAGNOSIS — J45909 Unspecified asthma, uncomplicated: Secondary | ICD-10-CM | POA: Diagnosis not present

## 2021-04-26 DIAGNOSIS — Z79890 Hormone replacement therapy (postmenopausal): Secondary | ICD-10-CM | POA: Diagnosis not present

## 2021-04-26 DIAGNOSIS — Z888 Allergy status to other drugs, medicaments and biological substances status: Secondary | ICD-10-CM | POA: Diagnosis not present

## 2021-04-26 DIAGNOSIS — G43909 Migraine, unspecified, not intractable, without status migrainosus: Secondary | ICD-10-CM | POA: Diagnosis not present

## 2021-04-26 DIAGNOSIS — Z79899 Other long term (current) drug therapy: Secondary | ICD-10-CM | POA: Diagnosis not present

## 2021-04-26 DIAGNOSIS — N907 Vulvar cyst: Secondary | ICD-10-CM | POA: Diagnosis not present

## 2021-04-26 DIAGNOSIS — F32A Depression, unspecified: Secondary | ICD-10-CM | POA: Diagnosis not present

## 2021-04-26 DIAGNOSIS — Z882 Allergy status to sulfonamides status: Secondary | ICD-10-CM | POA: Diagnosis not present

## 2021-04-26 DIAGNOSIS — N764 Abscess of vulva: Secondary | ICD-10-CM | POA: Diagnosis not present

## 2021-04-26 DIAGNOSIS — M1611 Unilateral primary osteoarthritis, right hip: Secondary | ICD-10-CM | POA: Diagnosis not present

## 2021-04-26 DIAGNOSIS — Z7982 Long term (current) use of aspirin: Secondary | ICD-10-CM | POA: Diagnosis not present

## 2021-05-12 ENCOUNTER — Ambulatory Visit: Payer: PPO

## 2021-05-18 ENCOUNTER — Other Ambulatory Visit: Payer: Self-pay | Admitting: Adult Health

## 2021-05-19 DIAGNOSIS — H35361 Drusen (degenerative) of macula, right eye: Secondary | ICD-10-CM | POA: Diagnosis not present

## 2021-05-25 ENCOUNTER — Ambulatory Visit: Payer: PPO

## 2021-06-08 ENCOUNTER — Ambulatory Visit: Payer: PPO

## 2021-06-09 ENCOUNTER — Other Ambulatory Visit: Payer: Self-pay

## 2021-06-09 ENCOUNTER — Ambulatory Visit: Payer: PPO

## 2021-06-09 DIAGNOSIS — R946 Abnormal results of thyroid function studies: Secondary | ICD-10-CM | POA: Diagnosis not present

## 2021-06-09 NOTE — Progress Notes (Signed)
The patient returns today for repeat TSH. She reports that she tries most days to wait 30 minutes after taking her medication before having her coffee and breakfast, but does not always get that whole time in before eating/drinking. She also reports that she thinks she is not taking any biotin product; however, she has never been told that information before and will be sure to look at her medications. She has not other issues to report today.  ?

## 2021-06-10 ENCOUNTER — Other Ambulatory Visit: Payer: Self-pay | Admitting: Nurse Practitioner

## 2021-06-10 LAB — TSH: TSH: 4.82 mIU/L — ABNORMAL HIGH (ref 0.40–4.50)

## 2021-06-10 MED ORDER — LEVOTHYROXINE SODIUM 50 MCG PO TABS: 50.0000 ug | ORAL_TABLET | Freq: Every day | ORAL | 11 refills | Status: DC

## 2021-06-23 DIAGNOSIS — L570 Actinic keratosis: Secondary | ICD-10-CM | POA: Diagnosis not present

## 2021-07-05 DIAGNOSIS — N907 Vulvar cyst: Secondary | ICD-10-CM | POA: Diagnosis not present

## 2021-08-03 ENCOUNTER — Other Ambulatory Visit: Payer: Self-pay | Admitting: Nurse Practitioner

## 2021-08-03 DIAGNOSIS — Z79899 Other long term (current) drug therapy: Secondary | ICD-10-CM

## 2021-08-16 DIAGNOSIS — M25531 Pain in right wrist: Secondary | ICD-10-CM | POA: Diagnosis not present

## 2021-08-24 DIAGNOSIS — Z1231 Encounter for screening mammogram for malignant neoplasm of breast: Secondary | ICD-10-CM | POA: Diagnosis not present

## 2021-08-24 LAB — HM MAMMOGRAPHY

## 2021-08-25 DIAGNOSIS — Z01419 Encounter for gynecological examination (general) (routine) without abnormal findings: Secondary | ICD-10-CM | POA: Diagnosis not present

## 2021-08-25 DIAGNOSIS — Z124 Encounter for screening for malignant neoplasm of cervix: Secondary | ICD-10-CM | POA: Diagnosis not present

## 2021-10-06 ENCOUNTER — Encounter: Payer: Self-pay | Admitting: Internal Medicine

## 2021-10-13 DIAGNOSIS — Z86007 Personal history of in-situ neoplasm of skin: Secondary | ICD-10-CM | POA: Diagnosis not present

## 2021-10-13 DIAGNOSIS — L821 Other seborrheic keratosis: Secondary | ICD-10-CM | POA: Diagnosis not present

## 2021-10-13 DIAGNOSIS — D225 Melanocytic nevi of trunk: Secondary | ICD-10-CM | POA: Diagnosis not present

## 2021-10-13 DIAGNOSIS — Z08 Encounter for follow-up examination after completed treatment for malignant neoplasm: Secondary | ICD-10-CM | POA: Diagnosis not present

## 2021-10-13 DIAGNOSIS — L814 Other melanin hyperpigmentation: Secondary | ICD-10-CM | POA: Diagnosis not present

## 2021-10-13 DIAGNOSIS — L570 Actinic keratosis: Secondary | ICD-10-CM | POA: Diagnosis not present

## 2021-10-13 DIAGNOSIS — Z85828 Personal history of other malignant neoplasm of skin: Secondary | ICD-10-CM | POA: Diagnosis not present

## 2021-11-08 ENCOUNTER — Other Ambulatory Visit: Payer: Self-pay

## 2021-11-08 ENCOUNTER — Encounter (HOSPITAL_COMMUNITY): Payer: Self-pay

## 2021-11-08 ENCOUNTER — Emergency Department (HOSPITAL_COMMUNITY): Payer: PPO

## 2021-11-08 ENCOUNTER — Emergency Department (HOSPITAL_COMMUNITY)
Admission: EM | Admit: 2021-11-08 | Discharge: 2021-11-08 | Disposition: A | Discharge status: Home / Self Care | Payer: PPO | Attending: Emergency Medicine | Admitting: Emergency Medicine

## 2021-11-08 DIAGNOSIS — Z8673 Personal history of transient ischemic attack (TIA), and cerebral infarction without residual deficits: Secondary | ICD-10-CM | POA: Insufficient documentation

## 2021-11-08 DIAGNOSIS — R002 Palpitations: Secondary | ICD-10-CM | POA: Diagnosis not present

## 2021-11-08 DIAGNOSIS — R82998 Other abnormal findings in urine: Secondary | ICD-10-CM | POA: Diagnosis not present

## 2021-11-08 DIAGNOSIS — H539 Unspecified visual disturbance: Secondary | ICD-10-CM

## 2021-11-08 DIAGNOSIS — R9431 Abnormal electrocardiogram [ECG] [EKG]: Secondary | ICD-10-CM | POA: Diagnosis not present

## 2021-11-08 DIAGNOSIS — Z79899 Other long term (current) drug therapy: Secondary | ICD-10-CM | POA: Diagnosis not present

## 2021-11-08 DIAGNOSIS — Z7982 Long term (current) use of aspirin: Secondary | ICD-10-CM | POA: Insufficient documentation

## 2021-11-08 DIAGNOSIS — R531 Weakness: Secondary | ICD-10-CM | POA: Diagnosis not present

## 2021-11-08 LAB — I-STAT CHEM 8, ED
BUN: 23 mg/dL (ref 8–23)
Calcium, Ion: 1.19 mmol/L (ref 1.15–1.40)
Chloride: 102 mmol/L (ref 98–111)
Creatinine, Ser: 0.8 mg/dL (ref 0.44–1.00)
Glucose, Bld: 86 mg/dL (ref 70–99)
HCT: 44 % (ref 36.0–46.0)
Hemoglobin: 15 g/dL (ref 12.0–15.0)
Potassium: 3.9 mmol/L (ref 3.5–5.1)
Sodium: 140 mmol/L (ref 135–145)
TCO2: 29 mmol/L (ref 22–32)

## 2021-11-08 LAB — CBC
HCT: 44.3 % (ref 36.0–46.0)
Hemoglobin: 14.6 g/dL (ref 12.0–15.0)
MCH: 31.7 pg (ref 26.0–34.0)
MCHC: 33 g/dL (ref 30.0–36.0)
MCV: 96.3 fL (ref 80.0–100.0)
Platelets: 256 10*3/uL (ref 150–400)
RBC: 4.6 MIL/uL (ref 3.87–5.11)
RDW: 13.5 % (ref 11.5–15.5)
WBC: 4.5 10*3/uL (ref 4.0–10.5)
nRBC: 0 % (ref 0.0–0.2)

## 2021-11-08 LAB — APTT: aPTT: 24 seconds (ref 24–36)

## 2021-11-08 LAB — PROTIME-INR
INR: 1 (ref 0.8–1.2)
Prothrombin Time: 13 seconds (ref 11.4–15.2)

## 2021-11-08 LAB — DIFFERENTIAL
Abs Immature Granulocytes: 0.01 10*3/uL (ref 0.00–0.07)
Basophils Absolute: 0 10*3/uL (ref 0.0–0.1)
Basophils Relative: 1 %
Eosinophils Absolute: 0.1 10*3/uL (ref 0.0–0.5)
Eosinophils Relative: 3 %
Immature Granulocytes: 0 %
Lymphocytes Relative: 32 %
Lymphs Abs: 1.4 10*3/uL (ref 0.7–4.0)
Monocytes Absolute: 0.4 10*3/uL (ref 0.1–1.0)
Monocytes Relative: 9 %
Neutro Abs: 2.5 10*3/uL (ref 1.7–7.7)
Neutrophils Relative %: 55 %

## 2021-11-08 LAB — COMPREHENSIVE METABOLIC PANEL
ALT: 28 U/L (ref 0–44)
AST: 40 U/L (ref 15–41)
Albumin: 4.4 g/dL (ref 3.5–5.0)
Alkaline Phosphatase: 53 U/L (ref 38–126)
Anion gap: 8 (ref 5–15)
BUN: 23 mg/dL (ref 8–23)
CO2: 27 mmol/L (ref 22–32)
Calcium: 9.4 mg/dL (ref 8.9–10.3)
Chloride: 104 mmol/L (ref 98–111)
Creatinine, Ser: 0.8 mg/dL (ref 0.44–1.00)
GFR, Estimated: >60 mL/min (ref >60)
Glucose, Bld: 93 mg/dL (ref 70–99)
Potassium: 3.9 mmol/L (ref 3.5–5.1)
Sodium: 139 mmol/L (ref 135–145)
Total Bilirubin: 0.9 mg/dL (ref 0.3–1.2)
Total Protein: 7.2 g/dL (ref 6.5–8.1)

## 2021-11-08 LAB — ETHANOL: Alcohol, Ethyl (B): <10 mg/dL (ref <10)

## 2021-11-08 LAB — URINALYSIS, ROUTINE W REFLEX MICROSCOPIC
Bilirubin Urine: NEGATIVE
Glucose, UA: NEGATIVE mg/dL
Hgb urine dipstick: NEGATIVE
Ketones, ur: 20 mg/dL — AB
Leukocytes,Ua: NEGATIVE
Nitrite: NEGATIVE
Protein, ur: NEGATIVE mg/dL
Specific Gravity, Urine: 1.021 (ref 1.005–1.030)
pH: 6 (ref 5.0–8.0)

## 2021-11-08 LAB — RAPID URINE DRUG SCREEN, HOSP PERFORMED
Amphetamines: NOT DETECTED
Barbiturates: NOT DETECTED
Benzodiazepines: NOT DETECTED
Cocaine: NOT DETECTED
Opiates: NOT DETECTED
Tetrahydrocannabinol: NOT DETECTED

## 2021-11-08 MED ORDER — ASPIRIN 81 MG PO CHEW: 81.0000 mg | CHEWABLE_TABLET | Freq: Every day | ORAL | 0 refills | Status: AC

## 2021-11-08 NOTE — ED Provider Notes (Signed)
[facility] DEPT Provider Note   CSN: [id number] Arrival date & time: 11/08/21  0935     History  Chief Complaint  Patient presents with   Weakness   Palpitations    Evelyn Martinez is a 66 y.o. female.  HPI Patient presents after episode of blurry vision that occurred 2 days ago.  Patient has a history of TIA.  She notes that she also has ongoing palpitations, and though her blurry vision has resolved this is an ongoing concern.  She drinks wine daily, does not smoke, is generally well, but notes substantial life stress, with an ongoing separation.  No clear precipitant, but 2 days ago the patient had episode of blurry vision that lasted minutes.  There is no pain associated, nor any weakness in any extremity.  Similarly, patient has developed no weakness anywhere since that time.  She denies any speech changes, facial asymmetry, discomfort.  After her TIA diagnosis several years ago she did not start aspirin, nor Plavix.    Home Medications Prior to Admission medications   Medication Sig Start Date End Date Taking? Authorizing Provider  aspirin 81 MG chewable tablet Chew 1 tablet (81 mg total) by mouth daily. 11/08/21  Yes Carmin Muskrat, MD  levothyroxine (SYNTHROID) 50 MCG tablet Take 1 tablet (50 mcg total) by mouth daily. 06/10/21 06/10/22  Alycia Rossetti, NP  albuterol (VENTOLIN HFA) 108 (90 Base) MCG/ACT inhaler Use 2 inhalations  15 minutes apart  every 4 hours  as Needed to Rescue Asthma 04/07/20   Unk Pinto, MD  Ascorbic Acid (VITAMIN C PO) Take 2,000 mg by mouth.     [provider]  Budeson-Glycopyrrol-Formoterol (BREZTRI AEROSPHERE) 160-9-4.8 MCG/ACT AERO Inhale 1-2 puffs into the lungs in the morning and at bedtime. 04/15/21   Liane Comber, NP  Cyanocobalamin (VITAMIN B-12 SL) Place 1 tablet under the tongue daily.    [provider]  ELDERBERRY PO Take by mouth. Takes 2 gummies daily    [provider]   fluticasone (FLONASE) 50 MCG/ACT nasal spray Place into the nose. 04/17/13   [provider]  MAGNESIUM MALATE PO Take by mouth. Takes 1 tablet daily.    [provider]  montelukast (SINGULAIR) 10 MG tablet TAKE 1 TABLET BY MOUTH DAILY FOR ALLERGIES 05/18/21   Liane Comber, NP  promethazine-dextromethorphan (PROMETHAZINE-DM) 6.25-15 MG/5ML syrup Take 5 mLs by mouth 4 (four) times daily as needed for cough. 04/01/21   Liane Comber, NP  TURMERIC PO Take 1 tablet by mouth daily.    [provider]  valACYclovir (VALTREX) 1000 MG tablet Take     1 tablet    Daily     for "Fever Blister" Prophylaxis 11/27/19   Unk Pinto, MD  VITAMIN D PO Take 5,000 Units by mouth. Takes 4 times a week    [provider]  VITAMIN K PO Take by mouth. Takes 2 to 3 times a week    [provider]  zinc gluconate 50 MG tablet Take 50 mg by mouth daily.    [provider]      Allergies    Epinephrine, Bactrim [sulfamethoxazole-trimethoprim], and Sulfamethoxazole    Review of Systems   Review of Systems  All other systems reviewed and are negative.   Physical Exam Updated Vital Signs BP (!) 133/96   Pulse 73   Temp 97.6 F (36.4 C) (Oral)   Resp 18   SpO2 100%  Physical Exam Vitals and nursing note  reviewed.  Constitutional:      General: She is not in acute distress.    Appearance: She is well-developed.  HENT:     Head: Normocephalic and atraumatic.  Eyes:     Conjunctiva/sclera: Conjunctivae normal.  Pulmonary:     Effort: Pulmonary effort is normal. No respiratory distress.     Breath sounds: Normal breath sounds. No stridor.  Abdominal:     General: There is no distension.  Skin:    General: Skin is warm and dry.  Neurological:     General: No focal deficit present.     Mental Status: She is alert and oriented to person, place, and time. Mental status is at baseline.     Cranial Nerves: No cranial nerve deficit.     Motor: No  weakness.     Coordination: Coordination normal.  Psychiatric:        Mood and Affect: Mood normal.     ED Results / Procedures / Treatments   Labs (all labs ordered are listed, but only abnormal results are displayed) Labs Reviewed  URINALYSIS, ROUTINE W REFLEX MICROSCOPIC - Abnormal; Notable for the following components:      Result Value   Ketones, ur 20 (*)    All other components within normal limits  ETHANOL  PROTIME-INR  APTT  CBC  DIFFERENTIAL  COMPREHENSIVE METABOLIC PANEL  RAPID URINE DRUG SCREEN, HOSP PERFORMED  I-STAT CHEM 8, ED    EKG EKG Interpretation  Date/Time:  Monday November 08 2021 10:15:00 EDT Ventricular Rate:  61 PR Interval:  156 QRS Duration: 99 QT Interval:  429 QTC Calculation: 433 R Axis:   69 Text Interpretation: Sinus rhythm ST-t wave abnormality Abnormal ECG Confirmed by Carmin Muskrat [phone]) on 11/08/2021 12:23:39 PM  Radiology MR BRAIN WO CONTRAST  Result Date: 11/08/2021 CLINICAL DATA:  Transient ischemic attack. Episodic visual disturbance over the last 3 days. Weakness. EXAM: MRI HEAD WITHOUT CONTRAST TECHNIQUE: Multiplanar, multiecho pulse sequences of the brain and surrounding structures were obtained without intravenous contrast. COMPARISON:  None Available. FINDINGS: Brain: Diffusion imaging does not show any acute or subacute infarction. The brainstem and cerebellum are normal. Cerebral hemispheres show scattered punctate foci of T2 and FLAIR signal affecting the hemispheric deep and subcortical white matter most consistent with an early manifestation of small vessel ischemic change. No cortical or large vessel territory infarction. No mass lesion, hemorrhage, hydrocephalus or extra-axial collection. Vascular: Major vessels at the base of the brain show flow. Skull and upper cervical spine: Negative except for degenerative cervical spondylosis. Sinuses/Orbits: Clear/normal Other: None IMPRESSION: No acute MR finding. Scattered punctate  foci of T2 and FLAIR signal affecting the cerebral hemispheric white matter most consistent with an early manifestation of small vessel ischemic change. Electronically Signed   By: Nelson Chimes M.D.   On: 11/08/2021 13:26    Procedures Procedures    Medications Ordered in ED Medications - No data to display  ED Course/ Medical Decision Making/ A&P This patient with a Hx of TIA presents to the ED for concern of visual changes, this involves an extensive number of treatment options, and is a complaint that carries with it a high risk of complications and morbidity.    The differential diagnosis includes TIA, less likely retinal detachment, vitreous hemorrhage, hypertensive crisis   Social Determinants of Health:  Alcohol use daily    After the initial evaluation, orders, including: MRI, labs monitoring were initiated.   Patient placed on Cardiac and Pulse-Oximetry Monitors. The  patient was maintained on a cardiac monitor.  The cardiac monitored showed an rhythm of 70 sinus normal The patient was also maintained on pulse oximetry. The readings were typically 100% room air normal   On repeat evaluation of the patient stayed the same  Lab Tests:  I personally interpreted labs.  The pertinent results include: Trace ketone urea otherwise labs are unremarkable  Imaging Studies ordered:  I independently visualized and interpreted imaging which showed MRI without any evidence for acute stroke I agree with the radiologist interpretation   2:56 PM Dispostion / Final MDM: Patient in no distress, awake, alert, speaking clearly, patient is unremarkable, she has no new complaints she is aware of all findings, we will length conversation about possibilities including TIA, intraocular etiology, hypertensive episode.  She remains marginally hypertensive, will discuss this with her physician.  With consideration of TIA or intraocular etiology, patient will follow-up with neurology and  ophthalmology respectively.  Ambulatory referral has been placed.  Low suspicion for stroke/TIA, but given this is a consideration, patient will start aspirin pending outpatient follow-up.    Final Clinical Impression(s) / ED Diagnoses Final diagnoses:  Palpitations  Vision changes    Rx / DC Orders ED Discharge Orders          Ordered    Ambulatory referral to Neurology       Comments: An appointment is requested in approximately: 1 week   11/08/21 1453    aspirin 81 MG chewable tablet  Daily        11/08/21 1453              Carmin Muskrat, MD 11/08/21 1456

## 2021-11-08 NOTE — ED Notes (Signed)
Patient transported to MRI 

## 2021-11-08 NOTE — ED Notes (Signed)
Patient has returned from MRI

## 2021-11-08 NOTE — ED Triage Notes (Addendum)
Pt arrived via POV, c/o palpitations, overall wkn, some blurred vision. States she had this sx before and had a TIA   No unilateral weakness. Denies any numbness or tingling. Grip strengths equal. No facial droop.

## 2021-11-08 NOTE — Discharge Instructions (Signed)
As discussed, today's evaluation has been generally reassuring.  Your MRI and labs have been generally reassuring.  There is no evidence on MRI for a stroke.  It is important that you do follow-up with our neurology colleagues, and ophthalmologist for evaluation within this week given concern for TIA and/or intraocular changes, respectively.  You have been prescribed aspirin, but may take an over-the-counter equivalent daily pending outpatient follow-up with neurology and your primary care physician.  Return here for concerning changes in your condition.

## 2021-11-09 ENCOUNTER — Encounter: Payer: PPO | Admitting: Nurse Practitioner

## 2021-11-11 NOTE — Progress Notes (Signed)
ER follow up  Assessment and Plan: Hospital visit follow up for:    Evelyn Martinez was seen today for follow-up.  Diagnoses and all orders for this visit:  TIA (transient ischemic attack) All deficits have resolved Ophthalmology exam was WNL, neurology exam next week  Elevated BP without diagnosis of hypertension Continue DASH diet, exercise and monitor BP, if consistently greater than 130/80 notify the office -     CBC with Differential/Platelet -     COMPLETE METABOLIC PANEL WITH GFR -     montelukast (SINGULAIR) 10 MG tablet; TAKE 1 TABLET BY MOUTH DAILY FOR ALLERGIES  Acquired hypothyroidism Please take your thyroid medication greater than 30 min before breakfast, separated by at least 4 hours  from antacids, calcium, iron, and multivitamins.  -     levothyroxine (SYNTHROID) 50 MCG tablet; Take 1 tablet (50 mcg total) by mouth daily. -     TSH  Mild intermittent asthma with exacerbation Continue singulair daily and Atrovent inhaler as needed -     montelukast (SINGULAIR) 10 MG tablet; TAKE 1 TABLET BY MOUTH DAILY FOR ALLERGIES  Mixed hyperlipidemia Continue diet and exercise -     Lipid panel  Insomnia, unspecified type Continue diet and exercise, practice good sleep hygiene -     traZODone (DESYREL) 50 MG tablet; 1/2-1 tablet for sleep     All medications were reviewed with patient and family and fully reconciled. All questions answered fully, and patient and family members were encouraged to call the office with any further questions or concerns. Discussed goal to avoid readmission related to this diagnosis.      Over 40 minutes of exam, counseling, chart review, and complex, high/moderate level critical decision making was performed this visit.   Future Appointments  Date Time Provider [place]  11/18/2021  8:15 AM Alric Ran, MD [facility] None  02/07/2022  9:00 AM Alycia Rossetti, NP [facility] None     HPI 66 y.o.female presents for follow up from ER  visit 11/08/21 Patient presents after episode of blurry vision that occurred 2 days ago.  Patient has a history of TIA.  She notes that she also has ongoing palpitations, and though her blurry vision has resolved this is an ongoing concern.  She drinks wine daily, does not smoke, is generally well, but notes substantial life stress, with an ongoing separation.  No clear precipitant, but 2 days ago the patient had episode of blurry vision that lasted minutes.  There is no pain associated, nor any weakness in any extremity.  Similarly, patient has developed no weakness anywhere since that time.  She denies any speech changes, facial asymmetry, discomfort.  After her TIA diagnosis several years ago she did not start aspirin, nor Plavix. Patient in no distress, awake, alert, speaking clearly, patient is unremarkable, she has no new complaints she is aware of all findings, we will length conversation about possibilities including TIA, intraocular etiology, hypertensive episode.  She remains marginally hypertensive, will discuss this with her physician.  With consideration of TIA or intraocular etiology, patient will follow-up with neurology and ophthalmology respectively.  Ambulatory referral has been placed.  Low suspicion for stroke/TIA, but given this is a consideration, patient will start aspirin pending outpatient follow-up.  Her husband was cheating on her and due to extra stress. [place]  She has had no further symptoms.  Denies headaches, paresthesias, visual changes, loss of consciousness, weakness. Was seen by ophthalmologist today and exam was  WNL  She has neurology appointment later this week.    She has been having increased difficulty sleeping, is not currently on any medication.  She is interested in something for sleep.   She is currently on Montelukast for asthma and Albuterol as needed.  Symptoms are currently well controlled and rarely has to use rescue  inhaler  She is currently on levothyroxine 50 mcg daily.  Lab Results  Component Value Date   TSH 4.82 (H) 06/09/2021    She is not currently on cholesterol medication, cholesterol was not at goal Lab Results  Component Value Date   CHOL 213 (H) 02/03/2021   HDL 123 02/03/2021   LDLCALC 77 02/03/2021   TRIG 46 02/03/2021   CHOLHDL 1.7 02/03/2021    Images while in the hospital: MR BRAIN WO CONTRAST  Result Date: 11/08/2021 CLINICAL DATA:  Transient ischemic attack. Episodic visual disturbance over the last 3 days. Weakness. EXAM: MRI HEAD WITHOUT CONTRAST TECHNIQUE: IMPRESSION: No acute MR finding. Scattered punctate foci of T2 and FLAIR signal affecting the cerebral hemispheric white matter most consistent with an early manifestation of small vessel ischemic change   Current Outpatient Medications (Endocrine & Metabolic):    levothyroxine (SYNTHROID) 50 MCG tablet, Take 1 tablet (50 mcg total) by mouth daily.     Current Outpatient Medications (Respiratory):    albuterol (VENTOLIN HFA) 108 (90 Base) MCG/ACT inhaler, Use 2 inhalations  15 minutes apart  every 4 hours  as Needed to Rescue Asthma   fluticasone (FLONASE) 50 MCG/ACT nasal spray, Place into the nose.   montelukast (SINGULAIR) 10 MG tablet, TAKE 1 TABLET BY MOUTH DAILY FOR ALLERGIES   Budeson-Glycopyrrol-Formoterol (BREZTRI AEROSPHERE) 160-9-4.8 MCG/ACT AERO, Inhale 1-2 puffs into the lungs in the morning and at bedtime. (Patient not taking: Reported on 11/16/2021)   promethazine-dextromethorphan (PROMETHAZINE-DM) 6.25-15 MG/5ML syrup, Take 5 mLs by mouth 4 (four) times daily as needed for cough. (Patient not taking: Reported on 11/16/2021)  Current Facility-Administered Medications (Respiratory):    ipratropium-albuterol (DUONEB) 0.5-2.5 (3) MG/3ML nebulizer solution 3 mL  Current Outpatient Medications (Analgesics):    aspirin 81 MG chewable tablet, Chew 1 tablet (81 mg total) by mouth daily.   Current Outpatient  Medications (Hematological):    Cyanocobalamin (VITAMIN B-12 SL), Place 1 tablet under the tongue daily.   Current Outpatient Medications (Other):    Ascorbic Acid (VITAMIN C PO), Take 2,000 mg by mouth.    ELDERBERRY PO, Take by mouth. Takes 2 gummies daily   MAGNESIUM MALATE PO, Take by mouth. Takes 1 tablet daily.   valACYclovir (VALTREX) 1000 MG tablet, Take     1 tablet    Daily     for "Fever Blister" Prophylaxis   VITAMIN D PO, Take 5,000 Units by mouth. Takes 4 times a week   VITAMIN K PO, Take by mouth. Takes 2 to 3 times a week   zinc gluconate 50 MG tablet, Take 50 mg by mouth daily.   TURMERIC PO, Take 1 tablet by mouth daily. (Patient not taking: Reported on 11/16/2021)   Past Medical History:  Diagnosis Date   Cancer ([place])    basel cell   Stroke ([place]) 09/21/2017   TIA ( pt states under a lot of anxiety and stress);went home from [place]    Thyroid disease      Allergies  Allergen Reactions   Epinephrine Palpitations    Increased HR    Bactrim [Sulfamethoxazole-Trimethoprim] Hives   Sulfamethoxazole Rash   Review of Systems  Constitutional:  Negative for chills, fever and weight loss.  HENT:  Negative for congestion, hearing loss, sinus pain, sore throat and tinnitus.   Eyes:  Negative for blurred vision and double vision.  Respiratory:  Negative for cough, hemoptysis, sputum production, shortness of breath and wheezing.   Cardiovascular:  Negative for chest pain, palpitations, orthopnea and leg swelling.  Gastrointestinal:  Negative for abdominal pain, constipation, diarrhea, heartburn, nausea and vomiting.  Genitourinary:  Negative for dysuria and urgency.  Musculoskeletal:  Negative for back pain, falls, joint pain, myalgias and neck pain.  Skin:  Negative for rash.  Neurological:  Negative for dizziness, tingling, tremors, loss of consciousness, weakness and headaches.  Endo/Heme/Allergies:  Does not bruise/bleed easily.  Psychiatric/Behavioral:  Negative for  depression, memory loss and suicidal ideas. The patient has insomnia. The patient is not nervous/anxious.      Physical Exam: Filed Weights   11/16/21 1540  Weight: 111 lb 9.6 oz (50.6 kg)   BP 112/62   Pulse 67   Temp (!) 97.5 F (36.4 C)   Ht 5' 1.5" (1.562 m)   Wt 111 lb 9.6 oz (50.6 kg)   SpO2 99%   BMI 20.75 kg/m  General Appearance: Well nourished, in no apparent distress. Eyes: PERRLA, EOMs, conjunctiva no swelling or erythema Sinuses: No Frontal/maxillary tenderness ENT/Mouth: Ext aud canals clear, TMs without erythema, bulging. No erythema, swelling, or exudate on post pharynx.  Tonsils not swollen or erythematous. Hearing normal.  Neck: Supple, thyroid normal.  Respiratory: Respiratory effort normal, BS equal bilaterally without rales, rhonchi, wheezing or stridor.  Cardio: RRR with no MRGs. Brisk peripheral pulses without edema.  Abdomen: Soft, + BS.  Non tender, no guarding, rebound, hernias, masses. Lymphatics: Non tender without lymphadenopathy.  Musculoskeletal: Full ROM, 5/5 strength, normal gait.  Skin: Warm, dry without rashes, lesions, ecchymosis.  Neuro: Cranial nerves intact. Normal muscle tone, no cerebellar symptoms. Sensation intact.  Psych: Awake and oriented X 3, normal affect, Insight and Judgment appropriate.     Alycia Rossetti, NP 3:51 PM [facility]

## 2021-11-16 ENCOUNTER — Ambulatory Visit (INDEPENDENT_AMBULATORY_CARE_PROVIDER_SITE_OTHER): Payer: PPO | Admitting: Nurse Practitioner

## 2021-11-16 ENCOUNTER — Encounter: Payer: Self-pay | Admitting: Nurse Practitioner

## 2021-11-16 VITALS — BP 112/62 | HR 67 | Temp 97.5°F | Ht 61.5 in | Wt 111.6 lb

## 2021-11-16 DIAGNOSIS — J4521 Mild intermittent asthma with (acute) exacerbation: Secondary | ICD-10-CM

## 2021-11-16 DIAGNOSIS — G4700 Insomnia, unspecified: Secondary | ICD-10-CM | POA: Diagnosis not present

## 2021-11-16 DIAGNOSIS — E039 Hypothyroidism, unspecified: Secondary | ICD-10-CM | POA: Diagnosis not present

## 2021-11-16 DIAGNOSIS — H53123 Transient visual loss, bilateral: Secondary | ICD-10-CM | POA: Diagnosis not present

## 2021-11-16 DIAGNOSIS — G459 Transient cerebral ischemic attack, unspecified: Secondary | ICD-10-CM | POA: Diagnosis not present

## 2021-11-16 DIAGNOSIS — E782 Mixed hyperlipidemia: Secondary | ICD-10-CM | POA: Diagnosis not present

## 2021-11-16 DIAGNOSIS — R030 Elevated blood-pressure reading, without diagnosis of hypertension: Secondary | ICD-10-CM

## 2021-11-16 MED ORDER — MONTELUKAST SODIUM 10 MG PO TABS: ORAL_TABLET | ORAL | 3 refills | Status: AC

## 2021-11-16 MED ORDER — LEVOTHYROXINE SODIUM 50 MCG PO TABS: 50.0000 ug | ORAL_TABLET | Freq: Every day | ORAL | 11 refills | Status: DC

## 2021-11-16 MED ORDER — TRAZODONE HCL 50 MG PO TABS: ORAL_TABLET | ORAL | 2 refills | Status: DC

## 2021-11-17 LAB — LIPID PANEL
Cholesterol: 194 mg/dL (ref <200)
HDL: 111 mg/dL (ref >=50)
LDL Cholesterol (Calc): 69 mg/dL (calc)
Non-HDL Cholesterol (Calc): 83 mg/dL (calc) (ref <130)
Total CHOL/HDL Ratio: 1.7 (calc) (ref <5.0)
Triglycerides: 68 mg/dL (ref <150)

## 2021-11-17 LAB — COMPLETE METABOLIC PANEL WITH GFR
AG Ratio: 1.9 (calc) (ref 1.0–2.5)
ALT: 20 U/L (ref 6–29)
AST: 30 U/L (ref 10–35)
Albumin: 4.4 g/dL (ref 3.6–5.1)
Alkaline phosphatase (APISO): 53 U/L (ref 37–153)
BUN/Creatinine Ratio: 27 (calc) — ABNORMAL HIGH (ref 6–22)
BUN: 28 mg/dL — ABNORMAL HIGH (ref 7–25)
CO2: 26 mmol/L (ref 20–32)
Calcium: 9.5 mg/dL (ref 8.6–10.4)
Chloride: 105 mmol/L (ref 98–110)
Creat: 1.04 mg/dL (ref 0.50–1.05)
Globulin: 2.3 g/dL (calc) (ref 1.9–3.7)
Glucose, Bld: 81 mg/dL (ref 65–99)
Potassium: 4.1 mmol/L (ref 3.5–5.3)
Sodium: 141 mmol/L (ref 135–146)
Total Bilirubin: 0.6 mg/dL (ref 0.2–1.2)
Total Protein: 6.7 g/dL (ref 6.1–8.1)
eGFR: 59 mL/min/{1.73_m2} — ABNORMAL LOW (ref >=60)

## 2021-11-17 LAB — CBC WITH DIFFERENTIAL/PLATELET
Absolute Monocytes: 590 cells/uL (ref 200–950)
Basophils Absolute: 53 cells/uL (ref 0–200)
Basophils Relative: 0.9 %
Eosinophils Absolute: 201 cells/uL (ref 15–500)
Eosinophils Relative: 3.4 %
HCT: 38.7 % (ref 35.0–45.0)
Hemoglobin: 13.2 g/dL (ref 11.7–15.5)
Lymphs Abs: 1776 cells/uL (ref 850–3900)
MCH: 32 pg (ref 27.0–33.0)
MCHC: 34.1 g/dL (ref 32.0–36.0)
MCV: 93.7 fL (ref 80.0–100.0)
MPV: 10.8 fL (ref 7.5–12.5)
Monocytes Relative: 10 %
Neutro Abs: 3280 cells/uL (ref 1500–7800)
Neutrophils Relative %: 55.6 %
Platelets: 262 10*3/uL (ref 140–400)
RBC: 4.13 10*6/uL (ref 3.80–5.10)
RDW: 12.7 % (ref 11.0–15.0)
Total Lymphocyte: 30.1 %
WBC: 5.9 10*3/uL (ref 3.8–10.8)

## 2021-11-17 LAB — TSH: TSH: 1.6 mIU/L (ref 0.40–4.50)

## 2021-11-18 ENCOUNTER — Ambulatory Visit: Payer: PPO | Admitting: Neurology

## 2021-11-18 ENCOUNTER — Telehealth: Payer: Self-pay | Admitting: Neurology

## 2021-11-18 ENCOUNTER — Encounter: Payer: Self-pay | Admitting: Neurology

## 2021-11-18 VITALS — BP 132/71 | HR 60 | Ht 62.0 in | Wt 111.5 lb

## 2021-11-18 DIAGNOSIS — G459 Transient cerebral ischemic attack, unspecified: Secondary | ICD-10-CM

## 2021-11-18 NOTE — Progress Notes (Signed)
[facility]  PATIENT: Evelyn Martinez DOB: 12/24/1955  REQUESTING CLINICIAN: Carmin Muskrat, MD HISTORY FROM: Patient  REASON FOR VISIT: TIA    HISTORICAL  CHIEF COMPLAINT:  Chief Complaint  Patient presents with   New Patient (Initial Visit)    Rm 12. Alone. NP/internal ED referral for possible TIA.    HISTORY OF PRESENT ILLNESS:  This is a 66 year old woman with no reported past medical history who is presenting after being seen in the hospital for TIA symptoms.  Patient reports experiencing 15 minutes of left eye blurry vision associated with generalized weakness.  She contacted her primary care doctor the next day and was directed to the ED.  Patient reports currently undergoing a lot of stress, going under separation with her ex-husband, sold her house, buy a new house and she is moving down to [place].  She reports in 2019 she had the same TIA symptoms, described as left eye blurry vision.  She was recommended aspirin but did not start it at that time.  Upon discharge last week she was also recommended aspirin but she is not taking it consistently.  Currently does not have any episodes.      OTHER MEDICAL CONDITIONS: Denies    REVIEW OF SYSTEMS: Full 14 system review of systems performed and negative with exception of: As noted in the HPI   ALLERGIES: Allergies  Allergen Reactions   Epinephrine Palpitations    Increased HR    Bactrim [Sulfamethoxazole-Trimethoprim] Hives   Sulfamethoxazole Rash    HOME MEDICATIONS: Outpatient Medications Prior to Visit  Medication Sig Dispense Refill   albuterol (VENTOLIN HFA) 108 (90 Base) MCG/ACT inhaler Use 2 inhalations  15 minutes apart  every 4 hours  as Needed to Rescue Asthma 48 g 1   Ascorbic Acid (VITAMIN C PO) Take 2,000 mg by mouth.      aspirin 81 MG chewable tablet Chew 1 tablet (81 mg total) by mouth daily. 30 tablet 0   Cyanocobalamin (VITAMIN B-12 SL) Place 1 tablet under the tongue  daily.     ELDERBERRY PO Take by mouth. Takes 2 gummies daily     fluticasone (FLONASE) 50 MCG/ACT nasal spray Place into the nose.     levothyroxine (SYNTHROID) 50 MCG tablet Take 1 tablet (50 mcg total) by mouth daily. 30 tablet 11   MAGNESIUM MALATE PO Take by mouth. Takes 1 tablet daily.     montelukast (SINGULAIR) 10 MG tablet TAKE 1 TABLET BY MOUTH DAILY FOR ALLERGIES 90 tablet 3   traZODone (DESYREL) 50 MG tablet 1/2-1 tablet for sleep 30 tablet 2   valACYclovir (VALTREX) 1000 MG tablet Take     1 tablet    Daily     for "Fever Blister" Prophylaxis 90 tablet 3   VITAMIN D PO Take 5,000 Units by mouth. Takes 4 times a week     VITAMIN K PO Take by mouth. Takes 2 to 3 times a week     zinc gluconate 50 MG tablet Take 50 mg by mouth daily.     Facility-Administered Medications Prior to Visit  Medication Dose Route Frequency Provider Last Rate Last Admin   ipratropium-albuterol (DUONEB) 0.5-2.5 (3) MG/3ML nebulizer solution 3 mL  3 mL Nebulization Once Liane Comber, NP        PAST MEDICAL HISTORY: Past Medical History:  Diagnosis Date   Cancer ([place])    basel cell   Stroke ([place]) 09/21/2017   TIA ( pt states under a  lot of anxiety and stress);went home from [place]    Thyroid disease     PAST SURGICAL HISTORY: Past Surgical History:  Procedure Laterality Date   BREAST ENHANCEMENT SURGERY     x2   CARPAL TUNNEL RELEASE Bilateral    CESAREAN SECTION  1983   COLONOSCOPY  10 years ago    [place]=Normal exam per pt   NEURECTOMY FOOT  2017    FAMILY HISTORY: Family History  Problem Relation Age of Onset   Uterine cancer Mother    Diabetes Father    Diabetes Brother    Diabetes Sister    Diabetes Sister    Colon cancer Neg Hx    Colon polyps Neg Hx    Esophageal cancer Neg Hx    Rectal cancer Neg Hx    Stomach cancer Neg Hx     SOCIAL HISTORY: Social History   Socioeconomic History   Marital status: Legally Separated    Spouse name: Not on file   Number  of children: Not on file   Years of education: Not on file   Highest education level: Not on file  Occupational History   Not on file  Tobacco Use   Smoking status: Never   Smokeless tobacco: Never  Vaping Use   Vaping Use: Never used  Substance and Sexual Activity   Alcohol use: Yes    Alcohol/week: 14.0 standard drinks of alcohol    Types: 14 Glasses of wine per week   Drug use: Not Currently   Sexual activity: Not on file  Other Topics Concern   Not on file  Social History Narrative   Not on file   Social Determinants of Health   Financial Resource Strain: Not on file  Food Insecurity: Not on file  Transportation Needs: Not on file  Physical Activity: Not on file  Stress: Not on file  Social Connections: Not on file  Intimate Partner Violence: Not on file    PHYSICAL EXAM  GENERAL EXAM/CONSTITUTIONAL: Vitals:  Vitals:   11/18/21 0802  BP: 132/71  Pulse: 60  Weight: 111 lb 8 oz (50.6 kg)  Height: '5\' 2"'$  (1.575 m)   Body mass index is 20.39 kg/m. Wt Readings from Last 3 Encounters:  11/18/21 111 lb 8 oz (50.6 kg)  11/16/21 111 lb 9.6 oz (50.6 kg)  11/08/21 120 lb (54.4 kg)   Patient is in no distress; well developed, nourished and groomed; neck is supple  EYES: Pupils round and reactive to light, Visual fields full to confrontation, Extraocular movements intacts,   MUSCULOSKELETAL: Gait, strength, tone, movements noted in Neurologic exam below  NEUROLOGIC: MENTAL STATUS:      No data to display         awake, alert, oriented to person, place and time recent and remote memory intact normal attention and concentration language fluent, comprehension intact, naming intact fund of knowledge appropriate  CRANIAL NERVE:  2nd, 3rd, 4th, 6th - pupils equal and reactive to light, visual fields full to confrontation, extraocular muscles intact, no nystagmus 5th - facial sensation symmetric 7th - facial strength symmetric 8th - hearing intact 9th -  palate elevates symmetrically, uvula midline 11th - shoulder shrug symmetric 12th - tongue protrusion midline  MOTOR:  normal bulk and tone, full strength in the BUE, BLE  SENSORY:  normal and symmetric to light touch  COORDINATION:  finger-nose-finger, fine finger movements normal  REFLEXES:  deep tendon reflexes present and symmetric  GAIT/STATION:  normal   DIAGNOSTIC DATA (  LABS, IMAGING, TESTING) - I reviewed patient records, labs, notes, testing and imaging myself where available.  Lab Results  Component Value Date   WBC 5.9 11/16/2021   HGB 13.2 11/16/2021   HCT 38.7 11/16/2021   MCV 93.7 11/16/2021   PLT 262 11/16/2021      Component Value Date/Time   NA 141 11/16/2021 1601   K 4.1 11/16/2021 1601   CL 105 11/16/2021 1601   CO2 26 11/16/2021 1601   GLUCOSE 81 11/16/2021 1601   BUN 28 (H) 11/16/2021 1601   CREATININE 1.04 11/16/2021 1601   CALCIUM 9.5 11/16/2021 1601   PROT 6.7 11/16/2021 1601   ALBUMIN 4.4 11/08/2021 1026   AST 30 11/16/2021 1601   ALT 20 11/16/2021 1601   ALKPHOS 53 11/08/2021 1026   BILITOT 0.6 11/16/2021 1601   GFRNONAA >60 11/08/2021 1026   GFRNONAA 60 05/20/2020 1426   GFRAA 70 05/20/2020 1426   Lab Results  Component Value Date   CHOL 194 11/16/2021   HDL 111 11/16/2021   LDLCALC 69 11/16/2021   TRIG 68 11/16/2021   CHOLHDL 1.7 11/16/2021   Lab Results  Component Value Date   HGBA1C 5.4 02/03/2021   Lab Results  Component Value Date   VITAMINB12 318 12/20/2018   Lab Results  Component Value Date   TSH 1.60 11/16/2021    MRI Brain 11/08/21 No acute MR finding. Scattered punctate foci of T2 and FLAIR signal affecting the cerebral hemispheric white matter most consistent with an early manifestation of small vessel ischemic change    ASSESSMENT AND PLAN  66 y.o. year old female with history of thyroid disease who is presenting after TIA symptoms described as left eye blurry vision and generalized weakness lasting  for 15 minutes.  Currently she is asymptomatic.  Her stroke labs were within normal limits, her A1c is 5.4 and her LDL 69.  At this time we will complete stroke/TIA work-up by getting a CT angiogram head and neck.  I will contact the patient to go over the result.  Her MRI brain was negative for any acute stroke or previous stroke but shows evidence of small vessel disease.  Continue to follow-up with PCP, advised patient to be consistent with aspirin 81 mg daily.  Return as needed    1. TIA (transient ischemic attack)      Patient Instructions  Continue with aspirin 81 mg daily  CT angiogram head and neck I will contact you to go over the results Continue to follow with PCP and return as needed  Orders Placed This Encounter  Procedures   CT ANGIO HEAD W OR WO CONTRAST   CT ANGIO HEAD NECK W WO CM (CODE STROKE)    No orders of the defined types were placed in this encounter.   Return if symptoms worsen or fail to improve.    Alric Ran, MD 11/18/2021, 8:37 AM  [facility] [address], [place], [place] [address] [phone]

## 2021-11-18 NOTE — Patient Instructions (Addendum)
Continue with aspirin 81 mg daily  CT angiogram head and neck I will contact you to go over the results Continue to follow with PCP and return as needed

## 2021-11-18 NOTE — Telephone Encounter (Signed)
healthteam adv NPR sent to GI

## 2021-11-23 ENCOUNTER — Ambulatory Visit
Admission: RE | Admit: 2021-11-23 | Discharge: 2021-11-23 | Disposition: A | Discharge status: Home / Self Care | Payer: PPO | Source: Ambulatory Visit | Attending: Neurology | Admitting: Neurology

## 2021-11-23 DIAGNOSIS — G459 Transient cerebral ischemic attack, unspecified: Secondary | ICD-10-CM

## 2021-11-23 DIAGNOSIS — Z8673 Personal history of transient ischemic attack (TIA), and cerebral infarction without residual deficits: Secondary | ICD-10-CM | POA: Diagnosis not present

## 2021-11-23 DIAGNOSIS — M47812 Spondylosis without myelopathy or radiculopathy, cervical region: Secondary | ICD-10-CM | POA: Diagnosis not present

## 2021-11-23 MED ORDER — IOPAMIDOL (ISOVUE-370) INJECTION 76%
80.0000 mL | Freq: Once | INTRAVENOUS | Status: AC | PRN
Start: 2021-11-23 — End: 2021-11-23
  Administered 2021-11-23: 80 mL via INTRAVENOUS

## 2021-11-25 DIAGNOSIS — L570 Actinic keratosis: Secondary | ICD-10-CM | POA: Diagnosis not present

## 2021-11-25 DIAGNOSIS — D485 Neoplasm of uncertain behavior of skin: Secondary | ICD-10-CM | POA: Diagnosis not present

## 2021-11-25 DIAGNOSIS — L820 Inflamed seborrheic keratosis: Secondary | ICD-10-CM | POA: Diagnosis not present

## 2021-11-25 DIAGNOSIS — L538 Other specified erythematous conditions: Secondary | ICD-10-CM | POA: Diagnosis not present

## 2021-11-26 ENCOUNTER — Ambulatory Visit: Payer: PPO | Admitting: Neurology

## 2021-12-09 ENCOUNTER — Other Ambulatory Visit: Payer: Self-pay | Admitting: Nurse Practitioner

## 2021-12-09 DIAGNOSIS — G4700 Insomnia, unspecified: Secondary | ICD-10-CM

## 2021-12-16 ENCOUNTER — Encounter: Payer: PPO | Admitting: Nurse Practitioner

## 2022-02-07 ENCOUNTER — Encounter: Payer: PPO | Admitting: Nurse Practitioner

## 2022-06-22 ENCOUNTER — Other Ambulatory Visit: Payer: Self-pay | Admitting: Nurse Practitioner

## 2022-06-22 DIAGNOSIS — G4700 Insomnia, unspecified: Secondary | ICD-10-CM

## 2022-08-20 ENCOUNTER — Other Ambulatory Visit: Payer: Self-pay | Admitting: Nurse Practitioner

## 2022-08-20 DIAGNOSIS — E039 Hypothyroidism, unspecified: Secondary | ICD-10-CM

## 2022-09-13 ENCOUNTER — Other Ambulatory Visit: Payer: Self-pay | Admitting: Internal Medicine

## 2022-09-13 DIAGNOSIS — E039 Hypothyroidism, unspecified: Secondary | ICD-10-CM

## 2022-09-17 ENCOUNTER — Other Ambulatory Visit: Payer: Self-pay | Admitting: Internal Medicine

## 2022-09-17 DIAGNOSIS — E039 Hypothyroidism, unspecified: Secondary | ICD-10-CM

## 2022-10-02 IMAGING — CR DG CHEST 2V
3 series · 3 of 3 positions shown · non-contrast
Comparison: None.

CLINICAL DATA: Dry cough.

EXAM:
CHEST - 2 VIEW

[w chest pa]
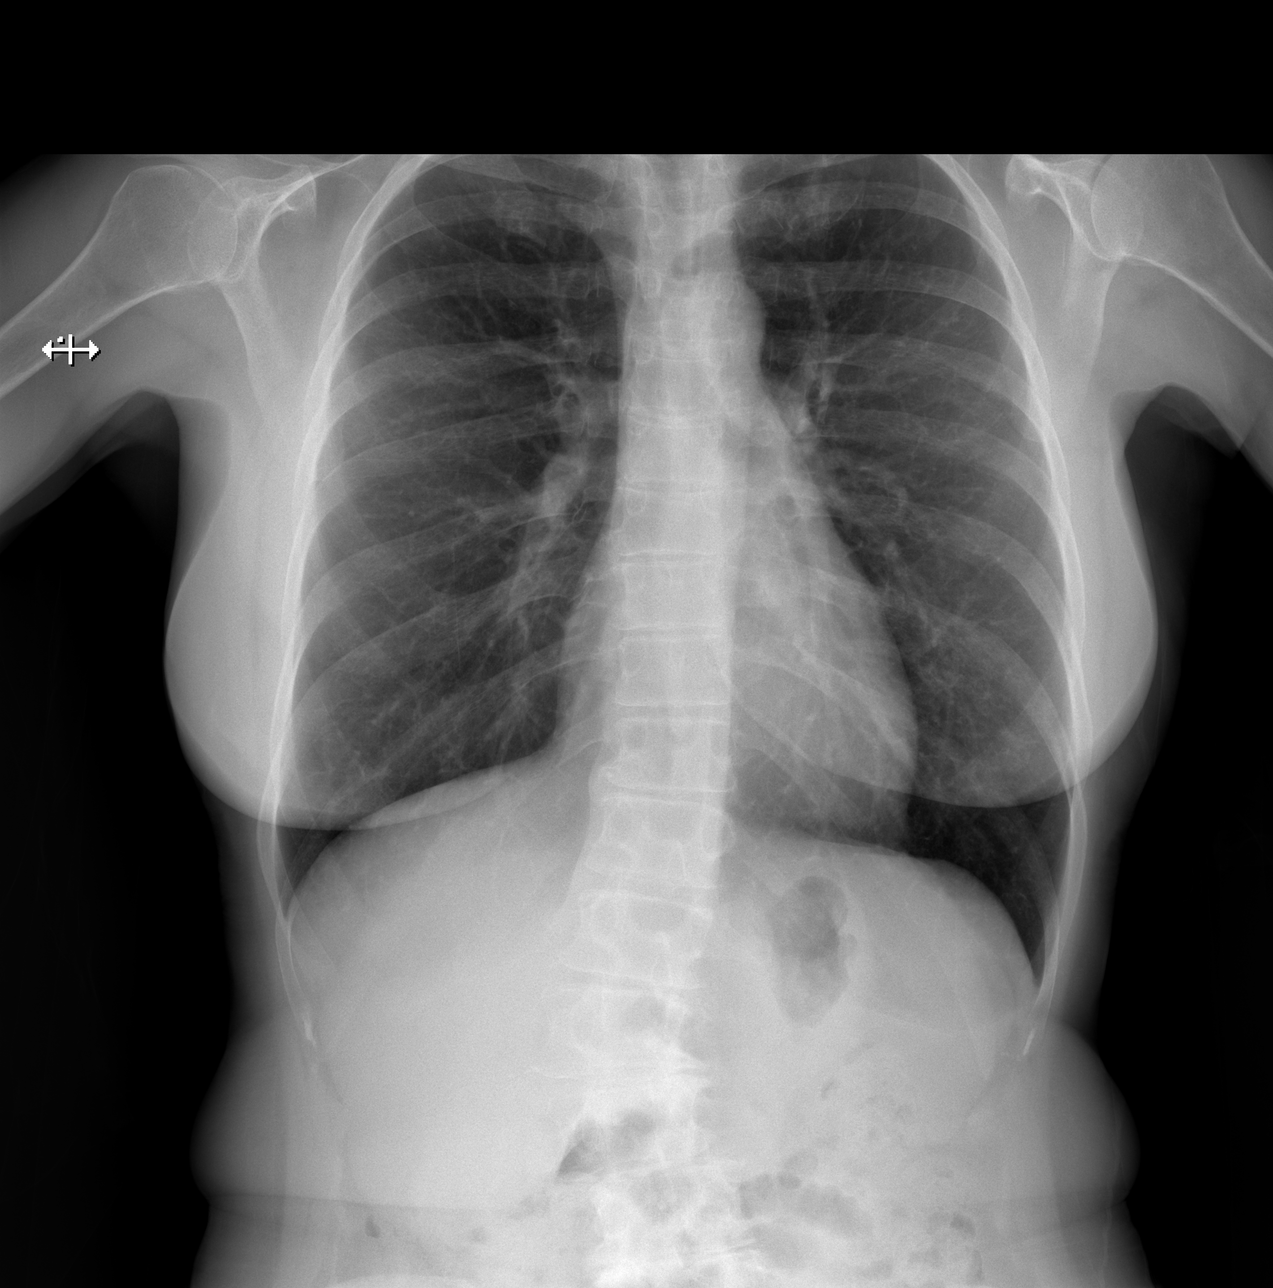

[w chest lat (1 of 2)]
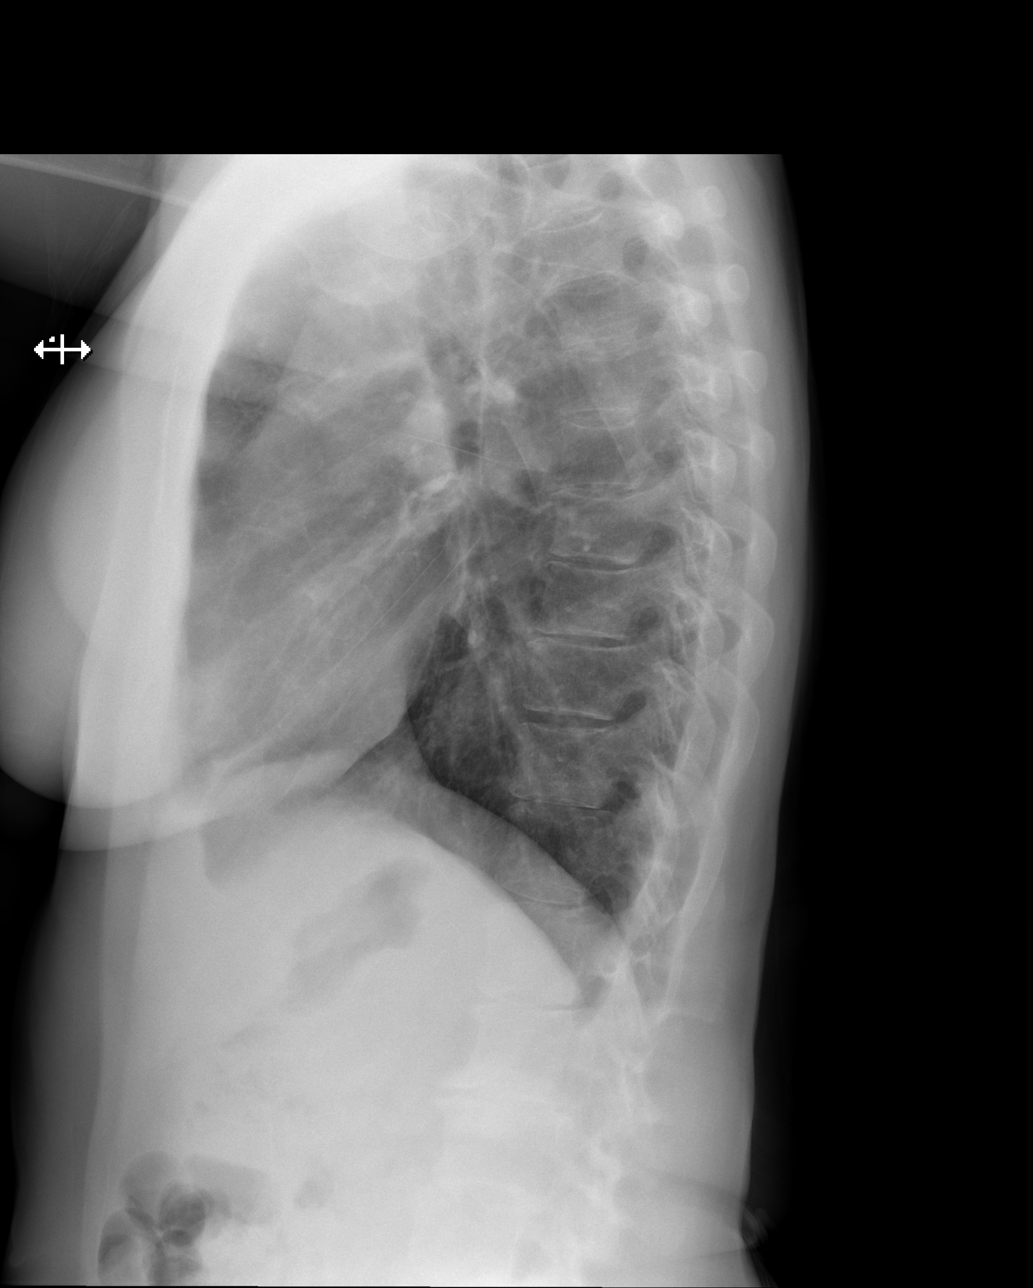

[w chest lat (2 of 2)]
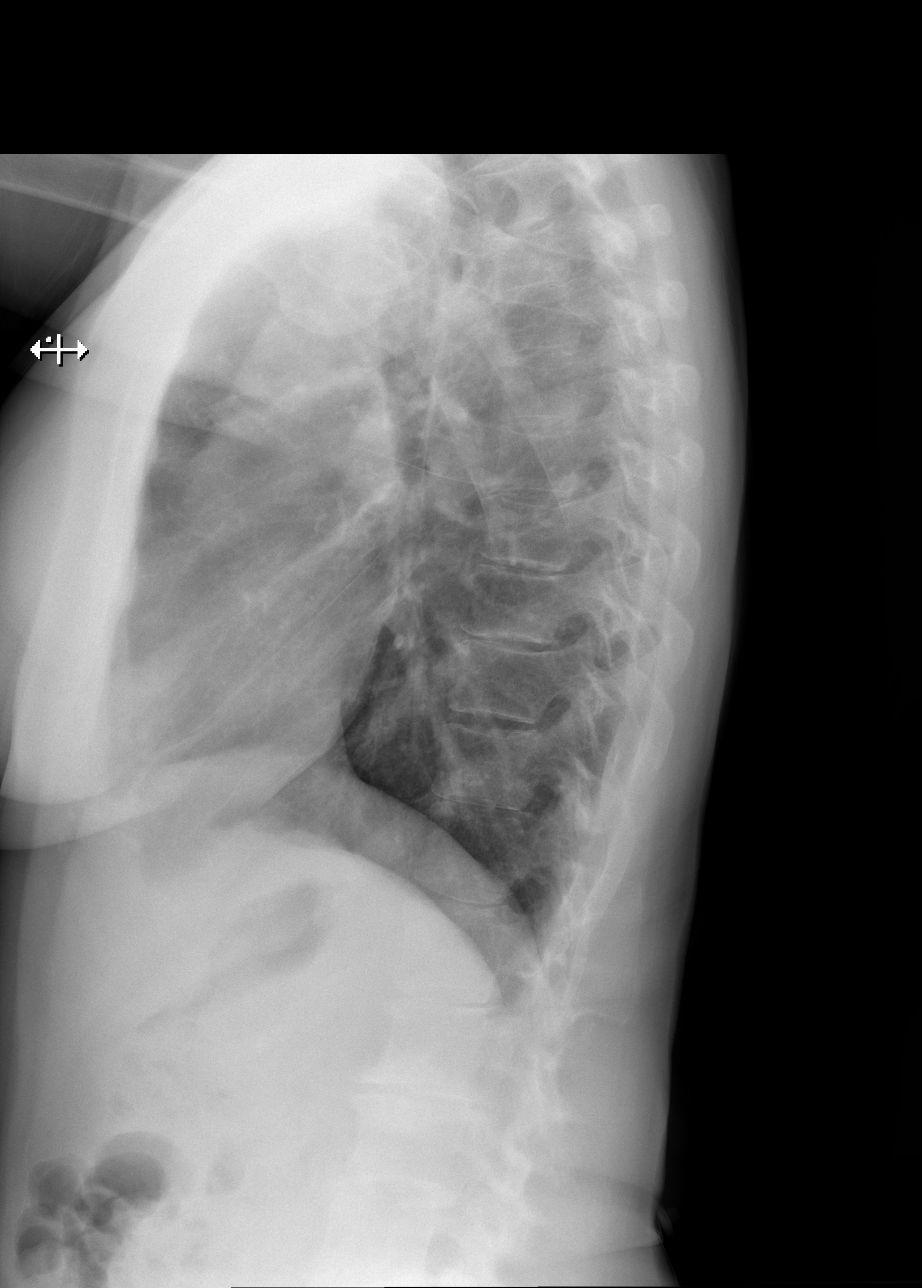

[3 of 3 positions shown; findings below may reference images not displayed]

FINDINGS: The lungs are clear without focal pneumonia, edema, pneumothorax or
pleural effusion. The cardiopericardial silhouette is within normal
limits for size. The visualized bony structures of the thorax show
no acute abnormality.
IMPRESSION: No active cardiopulmonary disease.

## 2022-12-22 ENCOUNTER — Other Ambulatory Visit: Payer: Self-pay | Admitting: Nurse Practitioner

## 2022-12-22 DIAGNOSIS — R030 Elevated blood-pressure reading, without diagnosis of hypertension: Secondary | ICD-10-CM

## 2022-12-22 DIAGNOSIS — J4521 Mild intermittent asthma with (acute) exacerbation: Secondary | ICD-10-CM

## 2022-12-22 DIAGNOSIS — G4700 Insomnia, unspecified: Secondary | ICD-10-CM
